# Patient Record
Sex: Female | Born: 1945
Health system: Southern US, Community
[De-identification: ages and names within clinical notes are randomized; demographics above are authoritative.]

## PROBLEM LIST (undated history)

## (undated) DIAGNOSIS — M8580 Other specified disorders of bone density and structure, unspecified site: Secondary | ICD-10-CM

## (undated) DIAGNOSIS — Z860101 Personal history of adenomatous and serrated colon polyps: Secondary | ICD-10-CM

## (undated) DIAGNOSIS — I10 Essential (primary) hypertension: Secondary | ICD-10-CM

## (undated) DIAGNOSIS — D4959 Neoplasm of unspecified behavior of other genitourinary organ: Secondary | ICD-10-CM

## (undated) DIAGNOSIS — H269 Unspecified cataract: Secondary | ICD-10-CM

## (undated) DIAGNOSIS — L900 Lichen sclerosus et atrophicus: Secondary | ICD-10-CM

## (undated) DIAGNOSIS — K635 Polyp of colon: Secondary | ICD-10-CM

## (undated) DIAGNOSIS — Z86010 Personal history of colonic polyps: Secondary | ICD-10-CM

## (undated) DIAGNOSIS — S82899A Other fracture of unspecified lower leg, initial encounter for closed fracture: Secondary | ICD-10-CM

## (undated) DIAGNOSIS — M79641 Pain in right hand: Secondary | ICD-10-CM

## (undated) DIAGNOSIS — F5104 Psychophysiologic insomnia: Secondary | ICD-10-CM

## (undated) HISTORY — DX: Psychophysiologic insomnia: F51.04

## (undated) HISTORY — DX: Lichen sclerosus et atrophicus: L90.0

## (undated) HISTORY — PX: OOPHORECTOMY: SHX86

## (undated) HISTORY — DX: Unspecified cataract: H26.9

## (undated) HISTORY — DX: Polyp of colon: K63.5

## (undated) HISTORY — DX: Other specified disorders of bone density and structure, unspecified site: M85.80

## (undated) HISTORY — DX: Other fracture of unspecified lower leg, initial encounter for closed fracture: S82.899A

## (undated) HISTORY — DX: Personal history of adenomatous and serrated colon polyps: Z86.0101

## (undated) HISTORY — DX: Personal history of colonic polyps: Z86.010

## (undated) HISTORY — DX: Pain in right hand: M79.641

## (undated) HISTORY — PX: FOOT SURGERY: SHX648

## (undated) HISTORY — PX: COLONOSCOPY: SHX174

---

## 1980-01-24 HISTORY — PX: LAPAROSCOPIC TUBAL LIGATION: SUR803

## 1988-01-24 HISTORY — PX: VAGINAL HYSTERECTOMY: SUR661

## 1997-09-02 ENCOUNTER — Other Ambulatory Visit: Admission: RE | Admit: 1997-09-02 | Discharge: 1997-09-02 | Payer: Self-pay | Admitting: Obstetrics and Gynecology

## 1998-10-05 ENCOUNTER — Other Ambulatory Visit: Admission: RE | Admit: 1998-10-05 | Discharge: 1998-10-05 | Payer: Self-pay | Admitting: Obstetrics and Gynecology

## 1999-10-10 ENCOUNTER — Other Ambulatory Visit: Admission: RE | Admit: 1999-10-10 | Discharge: 1999-10-10 | Payer: Self-pay | Admitting: Obstetrics and Gynecology

## 2000-10-18 ENCOUNTER — Other Ambulatory Visit: Admission: RE | Admit: 2000-10-18 | Discharge: 2000-10-18 | Payer: Self-pay | Admitting: Obstetrics and Gynecology

## 2001-10-21 ENCOUNTER — Other Ambulatory Visit: Admission: RE | Admit: 2001-10-21 | Discharge: 2001-10-21 | Payer: Self-pay | Admitting: Obstetrics and Gynecology

## 2002-04-20 ENCOUNTER — Emergency Department (HOSPITAL_COMMUNITY): Admission: EM | Admit: 2002-04-20 | Discharge: 2002-04-20 | Payer: Self-pay | Admitting: Emergency Medicine

## 2002-04-22 ENCOUNTER — Ambulatory Visit (HOSPITAL_COMMUNITY): Admission: RE | Admit: 2002-04-22 | Discharge: 2002-04-22 | Payer: Self-pay | Admitting: Family Medicine

## 2002-10-29 ENCOUNTER — Other Ambulatory Visit: Admission: RE | Admit: 2002-10-29 | Discharge: 2002-10-29 | Payer: Self-pay | Admitting: Obstetrics and Gynecology

## 2003-11-02 ENCOUNTER — Other Ambulatory Visit: Admission: RE | Admit: 2003-11-02 | Discharge: 2003-11-02 | Payer: Self-pay | Admitting: Obstetrics and Gynecology

## 2004-11-08 ENCOUNTER — Other Ambulatory Visit: Admission: RE | Admit: 2004-11-08 | Discharge: 2004-11-08 | Payer: Self-pay | Admitting: Obstetrics and Gynecology

## 2005-11-09 ENCOUNTER — Other Ambulatory Visit: Admission: RE | Admit: 2005-11-09 | Discharge: 2005-11-09 | Payer: Self-pay | Admitting: Obstetrics and Gynecology

## 2006-12-05 ENCOUNTER — Other Ambulatory Visit: Admission: RE | Admit: 2006-12-05 | Discharge: 2006-12-05 | Payer: Self-pay | Admitting: Obstetrics and Gynecology

## 2007-06-20 ENCOUNTER — Ambulatory Visit: Payer: Self-pay | Admitting: Internal Medicine

## 2007-07-23 ENCOUNTER — Encounter: Payer: Self-pay | Admitting: Internal Medicine

## 2007-07-23 ENCOUNTER — Ambulatory Visit: Payer: Self-pay | Admitting: Internal Medicine

## 2007-07-29 ENCOUNTER — Encounter: Payer: Self-pay | Admitting: Internal Medicine

## 2007-12-10 ENCOUNTER — Other Ambulatory Visit: Admission: RE | Admit: 2007-12-10 | Discharge: 2007-12-10 | Payer: Self-pay | Admitting: Obstetrics and Gynecology

## 2007-12-10 ENCOUNTER — Ambulatory Visit: Payer: Self-pay | Admitting: Obstetrics and Gynecology

## 2007-12-10 ENCOUNTER — Encounter: Payer: Self-pay | Admitting: Obstetrics and Gynecology

## 2008-08-25 ENCOUNTER — Ambulatory Visit: Payer: Self-pay | Admitting: Obstetrics and Gynecology

## 2009-01-05 ENCOUNTER — Other Ambulatory Visit: Admission: RE | Admit: 2009-01-05 | Discharge: 2009-01-05 | Payer: Self-pay | Admitting: Obstetrics and Gynecology

## 2009-01-05 ENCOUNTER — Ambulatory Visit: Payer: Self-pay | Admitting: Obstetrics and Gynecology

## 2010-01-06 ENCOUNTER — Other Ambulatory Visit
Admission: RE | Admit: 2010-01-06 | Discharge: 2010-01-06 | Payer: Self-pay | Source: Home / Self Care | Admitting: Obstetrics and Gynecology

## 2010-01-06 ENCOUNTER — Ambulatory Visit: Payer: Self-pay | Admitting: Obstetrics and Gynecology

## 2010-03-30 ENCOUNTER — Ambulatory Visit (INDEPENDENT_AMBULATORY_CARE_PROVIDER_SITE_OTHER): Payer: BC Managed Care – PPO | Admitting: Obstetrics and Gynecology

## 2010-03-30 DIAGNOSIS — B373 Candidiasis of vulva and vagina: Secondary | ICD-10-CM

## 2010-03-30 DIAGNOSIS — N898 Other specified noninflammatory disorders of vagina: Secondary | ICD-10-CM

## 2010-08-29 ENCOUNTER — Other Ambulatory Visit: Payer: Self-pay | Admitting: *Deleted

## 2010-08-29 ENCOUNTER — Encounter: Payer: Self-pay | Admitting: Obstetrics and Gynecology

## 2010-08-29 DIAGNOSIS — N6489 Other specified disorders of breast: Secondary | ICD-10-CM

## 2010-08-31 ENCOUNTER — Encounter: Payer: Self-pay | Admitting: Obstetrics and Gynecology

## 2010-12-22 ENCOUNTER — Encounter: Payer: Self-pay | Admitting: *Deleted

## 2010-12-22 DIAGNOSIS — M8580 Other specified disorders of bone density and structure, unspecified site: Secondary | ICD-10-CM | POA: Insufficient documentation

## 2010-12-22 DIAGNOSIS — D649 Anemia, unspecified: Secondary | ICD-10-CM | POA: Insufficient documentation

## 2011-01-11 ENCOUNTER — Ambulatory Visit (INDEPENDENT_AMBULATORY_CARE_PROVIDER_SITE_OTHER): Payer: BC Managed Care – PPO | Admitting: Obstetrics and Gynecology

## 2011-01-11 ENCOUNTER — Encounter: Payer: Self-pay | Admitting: Obstetrics and Gynecology

## 2011-01-11 ENCOUNTER — Other Ambulatory Visit (HOSPITAL_COMMUNITY)
Admission: RE | Admit: 2011-01-11 | Discharge: 2011-01-11 | Disposition: A | Discharge status: Home / Self Care | Payer: BC Managed Care – PPO | Source: Ambulatory Visit | Attending: Obstetrics and Gynecology | Admitting: Obstetrics and Gynecology

## 2011-01-11 VITALS — BP 132/80 | Ht 66.0 in | Wt 162.0 lb

## 2011-01-11 DIAGNOSIS — Z01419 Encounter for gynecological examination (general) (routine) without abnormal findings: Secondary | ICD-10-CM

## 2011-01-11 DIAGNOSIS — N644 Mastodynia: Secondary | ICD-10-CM

## 2011-01-11 DIAGNOSIS — R823 Hemoglobinuria: Secondary | ICD-10-CM

## 2011-01-11 DIAGNOSIS — N83209 Unspecified ovarian cyst, unspecified side: Secondary | ICD-10-CM

## 2011-01-11 NOTE — Progress Notes (Signed)
Addended byCammie Mcgee T on: 01/11/2011 02:39 PM   Modules accepted: Orders

## 2011-01-11 NOTE — Progress Notes (Signed)
The patient came to see me today for her annual GYN exam. She went to her PCP for her yearly visit with lab work. The patient had microscopic hematuria and was referred to urologist. CT scan of her abdomen and pelvis was done. A cystic lesion containing mural calcification was seen on the right ovary of 3 cm. The left ovary was normal. There was no ascites. In addition a low attenuation lesion was seen on the posterior right hepatic lobe a 1.7 cm. The patient completely asymptomatic. She is having no vaginal bleeding. She is having no pelvic pain. She is however having breast pain in her right upper quadrant of her right breast of 2 weeks duration. Her mammogram this year showed cyst in that area. She also has low bone mass and is currently on drug holiday from Fosam[place]. She is not having any menopausal symptoms.  HEENT: Within normal limits. Kennon Portela present. Neck: No masses. Supraclavicular lymph nodes: Not enlarged. Breasts: Examined in both sitting and lying position. Symmetrical without skin changes or masses. Abdomen: Soft no masses guarding or rebound. No hernias. Pelvic: External within normal limits. BUS within normal limits. Vaginal examination shows good estrogen effect, no cystocele enterocele or rectocele. Cervix and uterus absent. Adnexa within normal limits. Rectovaginal confirmatory. Extremities within normal limits.  Assessment: #1. Right ovarian cysts #2. Mastodynia right breast #3. Osteopenia  Plan: Pelvic ultrasound. Monitor breast pain. If pain persists in her breast 2 more weeks she should call us and we will schedule diagnostic mammogram with ultrasound of right breast. I told patient pain may be related to her cysts or cysts that is by septate. Patient will also discuss the liver lesion seen on CT scan with her PCP.

## 2011-01-24 HISTORY — PX: PELVIC LAPAROSCOPY: SHX162

## 2011-01-25 ENCOUNTER — Ambulatory Visit (INDEPENDENT_AMBULATORY_CARE_PROVIDER_SITE_OTHER): Payer: BC Managed Care – PPO

## 2011-01-25 ENCOUNTER — Ambulatory Visit (INDEPENDENT_AMBULATORY_CARE_PROVIDER_SITE_OTHER): Payer: BC Managed Care – PPO | Admitting: Obstetrics and Gynecology

## 2011-01-25 DIAGNOSIS — D3910 Neoplasm of uncertain behavior of unspecified ovary: Secondary | ICD-10-CM

## 2011-01-25 DIAGNOSIS — N7011 Chronic salpingitis: Secondary | ICD-10-CM

## 2011-01-25 DIAGNOSIS — N83209 Unspecified ovarian cyst, unspecified side: Secondary | ICD-10-CM

## 2011-01-25 DIAGNOSIS — N7013 Chronic salpingitis and oophoritis: Secondary | ICD-10-CM

## 2011-01-25 NOTE — Progress Notes (Signed)
The patient came back today to have an ultrasound because of a mass seen on the ovary on CT scan. On ultrasound today her uterus is surgically absent. The right ovary cannot be seen due to increased bowel shadowing. No mass could be appreciated. On the left ovary the patient does have microcalcifications. She also has a thin walled cystic mass of 1.3 cm with a small echogenic focus within the cyst. It is negative for vascular flow. There is also a small tubular cystic mass below the ovary of 1.5 cm. The cul-de-sac is free of fluid.  Assessment: Ovarian cyst and small hydrosalpinx of left adnexa.  Plan: Patient was reassured. Certainly I would feel better if I could see her right ovary. Since I want rescan her for stability of the left side we will get another chance to see the right side. She will take a fleets enema the night before the ultrasound. We will schedule this in 8 weeks.

## 2011-01-25 NOTE — Patient Instructions (Signed)
Schedule followup ultrasound in 2 months. Fleets enema the night before ultrasound.

## 2011-02-03 ENCOUNTER — Encounter: Payer: Self-pay | Admitting: Obstetrics and Gynecology

## 2011-03-22 ENCOUNTER — Other Ambulatory Visit: Payer: Self-pay | Admitting: Obstetrics and Gynecology

## 2011-03-22 ENCOUNTER — Ambulatory Visit (INDEPENDENT_AMBULATORY_CARE_PROVIDER_SITE_OTHER): Payer: BC Managed Care – PPO

## 2011-03-22 ENCOUNTER — Ambulatory Visit (INDEPENDENT_AMBULATORY_CARE_PROVIDER_SITE_OTHER): Payer: BC Managed Care – PPO | Admitting: Obstetrics and Gynecology

## 2011-03-22 DIAGNOSIS — R1904 Left lower quadrant abdominal swelling, mass and lump: Secondary | ICD-10-CM

## 2011-03-22 DIAGNOSIS — N949 Unspecified condition associated with female genital organs and menstrual cycle: Secondary | ICD-10-CM

## 2011-03-22 DIAGNOSIS — R1903 Right lower quadrant abdominal swelling, mass and lump: Secondary | ICD-10-CM

## 2011-03-22 DIAGNOSIS — D3910 Neoplasm of uncertain behavior of unspecified ovary: Secondary | ICD-10-CM

## 2011-03-22 DIAGNOSIS — D4959 Neoplasm of unspecified behavior of other genitourinary organ: Secondary | ICD-10-CM

## 2011-03-22 DIAGNOSIS — N83209 Unspecified ovarian cyst, unspecified side: Secondary | ICD-10-CM

## 2011-03-22 NOTE — Progress Notes (Signed)
Patient came back today for followup on ultrasound. She had a CT scan done as part of the workup for microscopic hematuria.they had seen a right adnexal mass. She came here and on ultrasound there was a mass on her left adnexa that appeared to be a hydrosalpinx but we could not image the right adnexa due to the bowel shadowing. We had her take a fleets enema and repeat the ultrasound today. On ultrasound today her uterus is surgically absent. On her right ovary there is a cystic solid mass of about 3 cm. There is echogenic debris layering and a solid calcification 9 mm. The mass is negative for PFD. On the left ovary there is a cystic mass of 1.6 cm with a solid focus within it of 6 mm. This was seen previously but the associated hydrosalpinx cannot be visualized today. This mass is also negative for PFD. The cul-de-sac is free of fluid. There is no ascitic fluid. Patient does have intermittent lower abdominal pain. However she's had this for many years.  Assessment: Bilateral adnexal masses.  Plan: CA 125 drawn. I told patient that I thought these were probably benign tumors. I also told her however that there was some risk in leaving them Even if the CA 125 is normal. We discussed laparoscopic BSO. She will think about it. We will rediscuss this with the CA 125 results.

## 2011-03-23 ENCOUNTER — Telehealth: Payer: Self-pay

## 2011-03-23 NOTE — Telephone Encounter (Signed)
At Dr. Verl Dicker request I called patient to let her know her CA 125 was normal.  Dr. Reece Agar rec Lap BSO as they discussed yesterday.  Patient said she did not know what to do. I offered her to come in and sit down and talk with Dr. Reece Agar some more about it. She asked if she could wait until after her trip March 1-9 and that would give her time to think about it.  I assured her that was fine. She did want to consult before deciding to schedule so we scheduled her, at her request, for March 19 at 9:30am.

## 2011-04-11 ENCOUNTER — Ambulatory Visit (INDEPENDENT_AMBULATORY_CARE_PROVIDER_SITE_OTHER): Payer: BC Managed Care – PPO | Admitting: Obstetrics and Gynecology

## 2011-04-11 DIAGNOSIS — R1900 Intra-abdominal and pelvic swelling, mass and lump, unspecified site: Secondary | ICD-10-CM

## 2011-04-11 NOTE — Progress Notes (Signed)
Patient came back today to discuss her complex cysts of both ovaries. I have explained to her although I suspect they're benign there is no guarantee because they are complex. We discussed laparoscopic BSO. We discussed risks including need for laparotomy, bleeding, injury to bowel bladder or ureter. After long discussion patient would rather proceed with surgery and continue ultrasound evaluation. These have been persistent on CT scan and 2 ultrasounds. We will schedule her surgery.

## 2011-04-14 ENCOUNTER — Telehealth: Payer: Self-pay

## 2011-04-14 NOTE — Telephone Encounter (Signed)
I spoke with patient and scheduled her Diag Lap w BSO for Monday April 8 7:30am at [facility].  Patient informed and instructed.  No preop consult needed per Dr. Reece Agar. Financial letter mailed to patient regarding insurance benefits and financial responsibility.

## 2011-04-20 ENCOUNTER — Encounter (HOSPITAL_BASED_OUTPATIENT_CLINIC_OR_DEPARTMENT_OTHER): Payer: Self-pay | Admitting: *Deleted

## 2011-04-24 ENCOUNTER — Encounter (HOSPITAL_BASED_OUTPATIENT_CLINIC_OR_DEPARTMENT_OTHER): Payer: Self-pay | Admitting: *Deleted

## 2011-04-24 NOTE — Progress Notes (Signed)
NPO AFTER MN . ARRIVES AT 0600. NEEDS CBC, BMET AND EKG. WILL TAKE COREG AM OF SURG. W/ SIP OF WATER. PT STATES AZOR RX RAN OUT, ADVISED PT TO CALL HER DOCTOR , GET REFILLED AND START TAKING IT THIS WEEK.

## 2011-04-25 ENCOUNTER — Telehealth: Payer: Self-pay

## 2011-04-25 NOTE — Telephone Encounter (Signed)
Patient said she takes a yoga class once a week and wondered if she would be able to do that after surgery. I told her Dr. Reece Agar will want her to wait for her two week post op check and let him examine her and he can advise her then.

## 2011-04-28 NOTE — H&P (Signed)
  Chief complaint: Bilateral complex cysts of the ovaries  History of present illness: Patient is a 66 year old gravida 2 para 2 AB 0 who during a workup for microscopic hematuria was found on CT scan to have a right adnexal mass. She was referred back to our office in January of 2013 ultrasound showed a cystic mass of her left ovary with an echogenic focus in the cyst wall. At that time bowel shadowing made it impossible to see the right ovary. She was treated with a fleets enema and on followup ultrasound on February 27 the right ovary could be seen. There was a 3.5 cm right ovarian mass which corresponded to the CT findings with echogenic debris, layering and calcification present. Patient has previously had a vaginal hysterectomy for fibroids, DUB, and anemia. CA 125 was normal. I have explained to the patient that this is unlikely to be a malignancy on either side but certainly could be a borderline tumor. Options given to the patient include observation or excision of both ovaries for a guarantee of the diagnoses. Patient understands risks of surgery and has elected to have a laparoscopy with bilateral salpingo-oophorectomy. She understands that there is some risk of requiring laparotomy.  Past medical history, family history, social history, and review of systems in epic record and reviewed.  HEENT: Within normal limits. Neck: No masses. Supraclavicular lymph nodes: Not enlarged. Breasts: Examined in both sitting and lying position. Symmetrical without skin changes or masses. Abdomen: Soft no masses guarding or rebound. No hernias. Pelvic: External within normal limits. BUS within normal limits. Vaginal examination shows good estrogen effect, no cystocele enterocele or rectocele. Cervix and uterus absent. Adnexa within normal limits. Rectovaginal confirmatory. Extremities within normal limits.  Assessment: Bilateral complex ovarian cysts  Plan: Diagnostic laparoscopy with bilateral  salpingo-oophorectomy

## 2011-04-30 NOTE — Anesthesia Preprocedure Evaluation (Signed)
Anesthesia Evaluation  Patient identified by MRN, date of birth, ID band Patient awake    Reviewed: Allergy & Precautions, H&P , NPO status , Patient's Chart, lab work & pertinent test results  Airway Mallampati: II TM Distance: >3 FB Neck ROM: Full    Dental No notable dental hx.    Pulmonary neg pulmonary ROS,  breath sounds clear to auscultation  Pulmonary exam normal       Cardiovascular hypertension, Pt. on medications and Pt. on home beta blockers Rhythm:Regular Rate:Normal     Neuro/Psych negative neurological ROS  negative psych ROS   GI/Hepatic negative GI ROS, Neg liver ROS,   Endo/Other  negative endocrine ROS  Renal/GU negative Renal ROS  negative genitourinary   Musculoskeletal negative musculoskeletal ROS (+)   Abdominal   Peds negative pediatric ROS (+)  Hematology negative hematology ROS (+)   Anesthesia Other Findings   Reproductive/Obstetrics negative OB ROS                           Anesthesia Physical Anesthesia Plan  ASA: II  Anesthesia Plan: General   Post-op Pain Management:    Induction: Intravenous  Airway Management Planned: Oral ETT  Additional Equipment:   Intra-op Plan:   Post-operative Plan: Extubation in OR  Informed Consent: I have reviewed the patients History and Physical, chart, labs and discussed the procedure including the risks, benefits and alternatives for the proposed anesthesia with the patient or authorized representative who has indicated his/her understanding and acceptance.   Dental advisory given  Plan Discussed with: CRNA  Anesthesia Plan Comments:         Anesthesia Quick Evaluation

## 2011-05-01 ENCOUNTER — Ambulatory Visit (HOSPITAL_BASED_OUTPATIENT_CLINIC_OR_DEPARTMENT_OTHER): Payer: BC Managed Care – PPO | Admitting: Anesthesiology

## 2011-05-01 ENCOUNTER — Encounter (HOSPITAL_BASED_OUTPATIENT_CLINIC_OR_DEPARTMENT_OTHER): Payer: Self-pay | Admitting: *Deleted

## 2011-05-01 ENCOUNTER — Ambulatory Visit (HOSPITAL_BASED_OUTPATIENT_CLINIC_OR_DEPARTMENT_OTHER)
Admission: RE | Admit: 2011-05-01 | Discharge: 2011-05-01 | Disposition: A | Discharge status: Home / Self Care | Payer: BC Managed Care – PPO | Source: Ambulatory Visit | Attending: Obstetrics and Gynecology | Admitting: Obstetrics and Gynecology

## 2011-05-01 ENCOUNTER — Encounter (HOSPITAL_BASED_OUTPATIENT_CLINIC_OR_DEPARTMENT_OTHER): Payer: Self-pay | Admitting: Anesthesiology

## 2011-05-01 ENCOUNTER — Encounter (HOSPITAL_BASED_OUTPATIENT_CLINIC_OR_DEPARTMENT_OTHER)
Admission: RE | Disposition: A | Discharge status: Home / Self Care | Payer: Self-pay | Source: Ambulatory Visit | Attending: Obstetrics and Gynecology

## 2011-05-01 DIAGNOSIS — N83209 Unspecified ovarian cyst, unspecified side: Secondary | ICD-10-CM

## 2011-05-01 DIAGNOSIS — D3910 Neoplasm of uncertain behavior of unspecified ovary: Secondary | ICD-10-CM

## 2011-05-01 DIAGNOSIS — Z90710 Acquired absence of both cervix and uterus: Secondary | ICD-10-CM | POA: Insufficient documentation

## 2011-05-01 DIAGNOSIS — D279 Benign neoplasm of unspecified ovary: Secondary | ICD-10-CM | POA: Insufficient documentation

## 2011-05-01 DIAGNOSIS — N9489 Other specified conditions associated with female genital organs and menstrual cycle: Secondary | ICD-10-CM | POA: Insufficient documentation

## 2011-05-01 HISTORY — DX: Neoplasm of unspecified behavior of other genitourinary organ: D49.59

## 2011-05-01 HISTORY — DX: Essential (primary) hypertension: I10

## 2011-05-01 LAB — CBC
HCT: 40.8 % (ref 36.0–46.0)
MCH: 28.5 pg (ref 26.0–34.0)
MCV: 86.1 fL (ref 78.0–100.0)
Platelets: 194 10*3/uL (ref 150–400)
RBC: 4.74 MIL/uL (ref 3.87–5.11)
RDW: 12.7 % (ref 11.5–15.5)

## 2011-05-01 LAB — BASIC METABOLIC PANEL
CO2: 27 mEq/L (ref 19–32)
Calcium: 9.3 mg/dL (ref 8.4–10.5)
Chloride: 105 mEq/L (ref 96–112)
Creatinine, Ser: 0.84 mg/dL (ref 0.50–1.10)
Glucose, Bld: 93 mg/dL (ref 70–99)

## 2011-05-01 SURGERY — SALPINGO-OOPHORECTOMY, BILATERAL, LAPAROSCOPIC
Anesthesia: General | Site: Abdomen | Laterality: Bilateral | Wound class: Clean

## 2011-05-01 MED ORDER — NEOSTIGMINE METHYLSULFATE 1 MG/ML IJ SOLN
INTRAMUSCULAR | Status: DC | PRN
  Administered 2011-05-01: 4 mg via INTRAVENOUS

## 2011-05-01 MED ORDER — HYDROCODONE-ACETAMINOPHEN 5-500 MG PO TABS: 1.0000 | ORAL_TABLET | Freq: Four times a day (QID) | ORAL | Status: AC | PRN

## 2011-05-01 MED ORDER — FENTANYL CITRATE 0.05 MG/ML IJ SOLN
INTRAMUSCULAR | Status: DC | PRN
  Administered 2011-05-01 (×2): 50 ug via INTRAVENOUS
  Administered 2011-05-01: 100 ug via INTRAVENOUS

## 2011-05-01 MED ORDER — LACTATED RINGERS IV SOLN
INTRAVENOUS | Status: DC
  Administered 2011-05-01 (×2): via INTRAVENOUS

## 2011-05-01 MED ORDER — HEPARIN SOD (PORK) LOCK FLUSH 100 UNIT/ML IV SOLN
INTRAVENOUS | Status: DC | PRN
  Administered 2011-05-01: 500 [IU] via INTRAVENOUS

## 2011-05-01 MED ORDER — ONDANSETRON HCL 4 MG/2ML IJ SOLN
INTRAMUSCULAR | Status: DC | PRN
  Administered 2011-05-01: 4 mg via INTRAVENOUS

## 2011-05-01 MED ORDER — DEXAMETHASONE SODIUM PHOSPHATE 4 MG/ML IJ SOLN
INTRAMUSCULAR | Status: DC | PRN
  Administered 2011-05-01: 10 mg via INTRAVENOUS

## 2011-05-01 MED ORDER — CEFAZOLIN SODIUM 1-5 GM-% IV SOLN
1.0000 g | INTRAVENOUS | Status: AC
  Administered 2011-05-01: 1 g via INTRAVENOUS

## 2011-05-01 MED ORDER — MIDAZOLAM HCL 5 MG/5ML IJ SOLN
INTRAMUSCULAR | Status: DC | PRN
  Administered 2011-05-01: 2 mg via INTRAVENOUS

## 2011-05-01 MED ORDER — GLYCOPYRROLATE 0.2 MG/ML IJ SOLN
INTRAMUSCULAR | Status: DC | PRN
  Administered 2011-05-01: .8 mg via INTRAVENOUS

## 2011-05-01 MED ORDER — LACTATED RINGERS IR SOLN
Status: DC | PRN
  Administered 2011-05-01: 3000 mL

## 2011-05-01 MED ORDER — FENTANYL CITRATE 0.05 MG/ML IJ SOLN: 25.0000 ug | INTRAMUSCULAR | Status: DC | PRN

## 2011-05-01 MED ORDER — KETOROLAC TROMETHAMINE 30 MG/ML IJ SOLN
INTRAMUSCULAR | Status: DC | PRN
  Administered 2011-05-01: 30 mg via INTRAVENOUS

## 2011-05-01 MED ORDER — PROMETHAZINE HCL 25 MG/ML IJ SOLN: 6.2500 mg | INTRAMUSCULAR | Status: DC | PRN

## 2011-05-01 MED ORDER — PROPOFOL 10 MG/ML IV EMUL
INTRAVENOUS | Status: DC | PRN
  Administered 2011-05-01: 150 mg via INTRAVENOUS
  Administered 2011-05-01: 50 mg via INTRAVENOUS

## 2011-05-01 SURGICAL SUPPLY — 64 items
ADH SKN CLS APL DERMABOND .7 (GAUZE/BANDAGES/DRESSINGS) ×4
APL SKNCLS STERI-STRIP NONHPOA (GAUZE/BANDAGES/DRESSINGS) ×2
APPLICATOR COTTON TIP 6IN STRL (MISCELLANEOUS) ×3 IMPLANT
BAG SPEC RTRVL LRG 6X4 10 (ENDOMECHANICALS)
BAG URINE DRAINAGE (UROLOGICAL SUPPLIES) ×2 IMPLANT
BANDAGE ADHESIVE 1X3 (GAUZE/BANDAGES/DRESSINGS) IMPLANT
BENZOIN TINCTURE PRP APPL 2/3 (GAUZE/BANDAGES/DRESSINGS) ×3 IMPLANT
BLADE SURG 11 STRL SS (BLADE) ×3 IMPLANT
CANISTER SUCTION 1200CC (MISCELLANEOUS) IMPLANT
CANISTER SUCTION 2500CC (MISCELLANEOUS) ×4 IMPLANT
CATH FOLEY 2WAY  5CC 16FR SIL (CATHETERS) ×1
CATH FOLEY 2WAY 5CC 16FR SIL (CATHETERS) ×1 IMPLANT
CATH ROBINSON RED A/P 16FR (CATHETERS) IMPLANT
CLOTH BEACON ORANGE TIMEOUT ST (SAFETY) ×3 IMPLANT
DERMABOND ADVANCED (GAUZE/BANDAGES/DRESSINGS) ×2
DERMABOND ADVANCED .7 DNX12 (GAUZE/BANDAGES/DRESSINGS) ×2 IMPLANT
DRAPE CAMERA CLOSED 9X96 (DRAPES) ×3 IMPLANT
DRAPE UNDERBUTTOCKS STRL (DRAPE) ×3 IMPLANT
DRESSING TELFA 8X3 (GAUZE/BANDAGES/DRESSINGS) IMPLANT
ELECT REM PT RETURN 9FT ADLT (ELECTROSURGICAL) ×3
ELECTRODE REM PT RTRN 9FT ADLT (ELECTROSURGICAL) ×2 IMPLANT
FILTER SMOKE EVAC LAPAROSHD (FILTER) IMPLANT
FORCEPS BIOP CUTTING (INSTRUMENTS) IMPLANT
GAS CARTRIDGE (MEDICAL GASES) IMPLANT
GLOVE BIO SURGEON STRL SZ7 (GLOVE) ×2 IMPLANT
GLOVE BIO SURGEON STRL SZ7.5 (GLOVE) ×2 IMPLANT
GLOVE ECLIPSE 6.0 STRL STRAW (GLOVE) ×2 IMPLANT
GLOVE ECLIPSE 7.0 STRL STRAW (GLOVE) ×6 IMPLANT
GLOVE INDICATOR 7.5 STRL GRN (GLOVE) ×3 IMPLANT
GOWN PREVENTION PLUS LG XLONG (DISPOSABLE) ×3 IMPLANT
GOWN SURGICAL XLG (GOWNS) ×4 IMPLANT
LAPAROSCOPY HANDPIECE LONG (MISCELLANEOUS) IMPLANT
LIGASURE 5MM LAPAROSCOPIC (INSTRUMENTS) IMPLANT
NS IRRIG 500ML POUR BTL (IV SOLUTION) IMPLANT
PACK BASIN DAY SURGERY FS (CUSTOM PROCEDURE TRAY) ×3 IMPLANT
PACK LAPAROSCOPY II (CUSTOM PROCEDURE TRAY) ×3 IMPLANT
PAD OB MATERNITY 4.3X12.25 (PERSONAL CARE ITEMS) ×3 IMPLANT
PAD PREP 24X48 CUFFED NSTRL (MISCELLANEOUS) ×3 IMPLANT
POUCH SPECIMEN RETRIEVAL 10MM (ENDOMECHANICALS) IMPLANT
SCALPEL HARMONIC ACE (MISCELLANEOUS) IMPLANT
SCISSORS LAP 5X35 DISP (ENDOMECHANICALS) IMPLANT
SEALER TISSUE G2 CVD JAW 35 (ENDOMECHANICALS) ×1 IMPLANT
SEALER TISSUE G2 CVD JAW 45CM (ENDOMECHANICALS) ×1
SET IRRIG TUBING LAPAROSCOPIC (IRRIGATION / IRRIGATOR) ×2 IMPLANT
SOLUTION ANTI FOG 6CC (MISCELLANEOUS) ×3 IMPLANT
SOLUTION ELECTROLUBE (MISCELLANEOUS) IMPLANT
STRIP CLOSURE SKIN 1/4X4 (GAUZE/BANDAGES/DRESSINGS) IMPLANT
SUT MNCRL AB 3-0 PS2 18 (SUTURE) IMPLANT
SUT MON AB 3-0 SH 27 (SUTURE) ×3 IMPLANT
SUT VICRYL 0 UR6 27IN ABS (SUTURE) ×6 IMPLANT
SYR 3ML 23GX1 SAFETY (SYRINGE) IMPLANT
SYRINGE 10CC LL (SYRINGE) IMPLANT
TOWEL OR 17X24 6PK STRL BLUE (TOWEL DISPOSABLE) ×8 IMPLANT
TRAY DSU PREP LF (CUSTOM PROCEDURE TRAY) ×3 IMPLANT
TROCAR 12M 150ML BLUNT (TROCAR) IMPLANT
TROCAR 5M 150ML BLDLS (TROCAR) IMPLANT
TROCAR BLADED SMOOTH C0639 (ENDOMECHANICALS) IMPLANT
TROCAR CANNULA 5X100MM C0Q10 (TROCAR) ×2 IMPLANT
TROCAR KII 5X100MM (TROCAR) ×1 IMPLANT
TROCAR XCEL BLUNT TIP 100MML (ENDOMECHANICALS) IMPLANT
TROCAR XCEL NON-BLD 11X100MML (ENDOMECHANICALS) ×3 IMPLANT
TUBING INSUFFLATION W/FILTER (TUBING) ×3 IMPLANT
VACUUM HOSE/TUBING 7/8INX6FT (MISCELLANEOUS) IMPLANT
WATER STERILE IRR 500ML POUR (IV SOLUTION) ×3 IMPLANT

## 2011-05-01 NOTE — Interval H&P Note (Signed)
History and Physical Interval Note:  05/01/2011 7:14 AM  Nancy Beck  has presented today for surgery, with the diagnosis of bilateral ovarian neoplasms  The various methods of treatment have been discussed with the patient and family. After consideration of risks, benefits and other options for treatment, the patient has consented to  Procedure(s) (LRB): LAPAROSCOPY DIAGNOSTIC (N/A) LAPAROSCOPIC BILATERAL SALPINGO OOPHERECTOMY (N/A) as a surgical intervention .  The patients' history has been reviewed, patient examined, no change in status, stable for surgery.  I have reviewed the patients' chart and labs.  Questions were answered to the patient's satisfaction.     Katia Hannen L  Date of Initial H&P: 04/27/2012  History reviewed, patient examined, no change in status, stable for surgery.

## 2011-05-01 NOTE — Anesthesia Procedure Notes (Signed)
Procedure Name: Intubation Date/Time: 05/01/2011 7:51 AM Performed by: Maris Berger T Pre-anesthesia Checklist: Patient identified, Emergency Drugs available, Suction available and Patient being monitored Patient Re-evaluated:Patient Re-evaluated prior to inductionOxygen Delivery Method: Circle System Utilized Preoxygenation: Pre-oxygenation with 100% oxygen Intubation Type: IV induction Ventilation: Mask ventilation without difficulty Laryngoscope Size: Mac and 3 Grade View: Grade I Tube type: Oral Number of attempts: 1 Airway Equipment and Method: stylet and LTA kit utilized Placement Confirmation: ETT inserted through vocal cords under direct vision,  positive ETCO2 and breath sounds checked- equal and bilateral Secured at: 22 cm Tube secured with: Tape Dental Injury: Teeth and Oropharynx as per pre-operative assessment

## 2011-05-01 NOTE — Transfer of Care (Signed)
Immediate Anesthesia Transfer of Care Note  Patient: Nancy Beck  Procedure(s) Performed: Procedure(s) (LRB): LAPAROSCOPIC BILATERAL SALPINGO OOPHERECTOMY (Bilateral)  Patient Location: PACU  Anesthesia Type: General  Level of Consciousness: awake, alert  and oriented  Airway & Oxygen Therapy: Patient Spontanous Breathing and Patient connected to face mask oxygen  Post-op Assessment: Report given to PACU RN  Post vital signs: Reviewed and stable  Complications: No apparent anesthesia complications

## 2011-05-01 NOTE — Progress Notes (Signed)
Assisted pt to bathroom to void. Pt became nausea & dizzy. VSS. No acute distress noted. Assisted pt back to recliner until pt feels better.

## 2011-05-01 NOTE — Anesthesia Postprocedure Evaluation (Signed)
  Anesthesia Post-op Note  Patient: Nancy Beck  Procedure(s) Performed: Procedure(s) (LRB): LAPAROSCOPIC BILATERAL SALPINGO OOPHERECTOMY (Bilateral)  Patient Location: PACU  Anesthesia Type: General  Level of Consciousness: awake and alert   Airway and Oxygen Therapy: Patient Spontanous Breathing  Post-op Pain: mild  Post-op Assessment: Post-op Vital signs reviewed, Patient's Cardiovascular Status Stable, Respiratory Function Stable, Patent Airway and No signs of Nausea or vomiting  Post-op Vital Signs: stable  Complications: No apparent anesthesia complications

## 2011-05-01 NOTE — Discharge Instructions (Signed)
Unilateral Salpingo-Oophorectomy Unilateral salpingo-oophorectomy is the removal of one fallopian tube and ovary. The fallopian tubes transport the egg from the ovary to the womb (uterus). The fallopian tube is also where the sperm and egg meet and become fertilized and move down into the uterus.  Removing one tube and ovary will not:  Cause problems with your menstrual periods.   Cause problems with your sex drive (libido).   Put you into the menopause.   Give symptoms of the menopause.   Make you not able to get pregnant (sterile).  There are several reasons for doing a salpingo-oophorectomy:  Infection of the tube and ovary.   There may be scar tissue of the tube and ovary (adhesions).   It may be necessary to remove the tube when the ovary has a cyst or tumor.   It may have to be done when removing the uterus.   It may have to be done when there is cancer of the tube or ovary.  LET YOUR CAREGIVER KNOW ABOUT:  Allergies to food or medications.   All the medications you are taking including prescription and over-the-counter herbs, eye drops and creams.   If you are using illegal drugs or excessive alcohol.   Your smoking habits.   Previous problems with anesthesia including numbing medication.   The possibility of being pregnant.   History of blood clots or bleeding problems.   Previous surgery.   Any other medical or health problems.  RISKS AND COMPLICATIONS  All surgery is associated with risks. Some of these risks are:  Injury to surrounding organs.   Bleeding.   Infection.   Blood clots in the legs or lungs.   Problems with the anesthesia.   The surgery does not help the problem.   Death.  BEFORE THE PROCEDURE  Do not take aspirin or blood thinners because it can make you bleed.   Do not eat or drink anything at least 8 hours before the surgery.   Let your caregiver know if you develop a cold or an infection.   If you are being admitted the day  of surgery, arrive at least one hour before the surgery.   Arrange for help when you go home from the hospital.   If you smoke, do not smoke for at least 2 weeks before the surgery.  PROCEDURE After being admitted to the hospital, you will change into a hospital gown. Then, you will be given an IV (intravenous) and a medication to relax you. Then, you will be put to sleep with an anesthetic. Any hair on your lower belly (abdomen) will be removed, and a catheter will be placed in your bladder. The fallopian tube and ovary will be removed either through 2 very small cuts (incisions) or through large incision in the lower abdomen. The blood vessels will be clamped and tied. AFTER THE PROCEDURE  You will be taken to the recovery room for 1 to 3 hours until your blood pressure, pulse and temperature are stable and you are waking up.   If you had a laparoscopy, you may be discharged in several hours.   If you had a large incision, you will be admitted to the hospital for a day or two.   If you had a laparoscopy, you may have shoulder pain. This is not unusual. It is from air that is left in the abdomen and affects the nerve that goes from the diaphragm to the shoulder. It goes away in a day or  two.   You will be given pain medication as necessary.   The intravenous and catheter will be removed before you are discharged.   Have someone available to take you home.  HOME CARE INSTRUCTIONS   It is normal to be sore for a week or two. Call your caregiver if the pain is getting worse or the pain medication is not helping.   Have help when you go home for a week or so to help with the household chores.   Follow your caregiver's advice regarding diet.   Get rest and sleep.   Only take over-the-counter or prescription medicines for pain or discomfort as directed by your caregiver.   Do not take aspirin. It can cause bleeding.   Do not drive, exercise or lift anything over 5 pounds.   Do not  drink alcohol until your caregiver gives you permission.   Do not lift anything over 5 pounds.   Do not have sexual intercourse until your caregiver says it is OK.   Take your temperature twice a day and write it down.   Change the bandage (dressing) as directed.   Make and keep your follow-up appointments for postoperative care.   If you become constipated, ask your caregiver about taking a mild laxative. Drinking more liquids than usual and eating bran foods can help prevent constipation.  SEEK MEDICAL CARE IF:   You have swelling or redness around the cut (incision).   You develop a rash.   You have side effects from the medication.   You feel lightheaded.   You need more or stronger medication.   You have pain, swelling or redness where the IV (intravenous) was placed.  SEEK IMMEDIATE MEDICAL CARE IF:   You develop an unexplained temperature above 100 F (37.8 C).   You develop increasing belly (abdominal) pain.   You have pus coming out of the incision.   You notice a bad smell coming from the wound or dressing.   The incision is separating.   There is excessive vaginal bleeding.   You start to feel sick to your stomach (nauseous) and vomit.   You have leg or chest pain.   You have pain when you urinate.   You develop shortness of breath.   You pass out.  Document Released: 11/06/2008 Document Revised: 12/29/2010 Document Reviewed: 11/06/2008 [facility] Patient Information 2012 [place], [place].Unilateral Salpingo-Oophorectomy Unilateral salpingo-oophorectomy is the removal of one fallopian tube and ovary. The fallopian tubes transport the egg from the ovary to the womb (uterus). The fallopian tube is also where the sperm and egg meet and become fertilized and move down into the uterus.  Removing one tube and ovary will not:  Cause problems with your menstrual periods.   Cause problems with your sex drive (libido).   Put you into the menopause.   Give  symptoms of the menopause.   Make you not able to get pregnant (sterile).  There are several reasons for doing a salpingo-oophorectomy:  Infection of the tube and ovary.   There may be scar tissue of the tube and ovary (adhesions).   It may be necessary to remove the tube when the ovary has a cyst or tumor.   It may have to be done when removing the uterus.   It may have to be done when there is cancer of the tube or ovary.  LET YOUR CAREGIVER KNOW ABOUT:  Allergies to food or medications.   All the medications you are taking including  prescription and over-the-counter herbs, eye drops and creams.   If you are using illegal drugs or excessive alcohol.   Your smoking habits.   Previous problems with anesthesia including numbing medication.   The possibility of being pregnant.   History of blood clots or bleeding problems.   Previous surgery.   Any other medical or health problems.  RISKS AND COMPLICATIONS  All surgery is associated with risks. Some of these risks are:  Injury to surrounding organs.   Bleeding.   Infection.   Blood clots in the legs or lungs.   Problems with the anesthesia.   The surgery does not help the problem.   Death.  BEFORE THE PROCEDURE  Do not take aspirin or blood thinners because it can make you bleed.   Do not eat or drink anything at least 8 hours before the surgery.   Let your caregiver know if you develop a cold or an infection.   If you are being admitted the day of surgery, arrive at least one hour before the surgery.   Arrange for help when you go home from the hospital.   If you smoke, do not smoke for at least 2 weeks before the surgery.  PROCEDURE After being admitted to the hospital, you will change into a hospital gown. Then, you will be given an IV (intravenous) and a medication to relax you. Then, you will be put to sleep with an anesthetic. Any hair on your lower belly (abdomen) will be removed, and a catheter  will be placed in your bladder. The fallopian tube and ovary will be removed either through 2 very small cuts (incisions) or through large incision in the lower abdomen. The blood vessels will be clamped and tied. AFTER THE PROCEDURE  You will be taken to the recovery room for 1 to 3 hours until your blood pressure, pulse and temperature are stable and you are waking up.   If you had a laparoscopy, you may be discharged in several hours.   If you had a large incision, you will be admitted to the hospital for a day or two.   If you had a laparoscopy, you may have shoulder pain. This is not unusual. It is from air that is left in the abdomen and affects the nerve that goes from the diaphragm to the shoulder. It goes away in a day or two.   You will be given pain medication as necessary.   The intravenous and catheter will be removed before you are discharged.   Have someone available to take you home.  HOME CARE INSTRUCTIONS   It is normal to be sore for a week or two. Call your caregiver if the pain is getting worse or the pain medication is not helping.   Have help when you go home for a week or so to help with the household chores.   Follow your caregiver's advice regarding diet.   Get rest and sleep.   Only take over-the-counter or prescription medicines for pain or discomfort as directed by your caregiver.   Do not take aspirin. It can cause bleeding.   Do not drive, exercise or lift anything over 5 pounds.   Do not drink alcohol until your caregiver gives you permission.   Do not lift anything over 5 pounds.   Do not have sexual intercourse until your caregiver says it is OK.   Take your temperature twice a day and write it down.   Change the bandage (dressing)  as directed.   Make and keep your follow-up appointments for postoperative care.   If you become constipated, ask your caregiver about taking a mild laxative. Drinking more liquids than usual and eating bran  foods can help prevent constipation.  SEEK MEDICAL CARE IF:   You have swelling or redness around the cut (incision).   You develop a rash.   You have side effects from the medication.   You feel lightheaded.   You need more or stronger medication.   You have pain, swelling or redness where the IV (intravenous) was placed.  SEEK IMMEDIATE MEDICAL CARE IF:   You develop an unexplained temperature above 100 F (37.8 C).   You develop increasing belly (abdominal) pain.   You have pus coming out of the incision.   You notice a bad smell coming from the wound or dressing.   The incision is separating.   There is excessive vaginal bleeding.   You start to feel sick to your stomach (nauseous) and vomit.   You have leg or chest pain.   You have pain when you urinate.   You develop shortness of breath.   You pass out.  Document Released: 11/06/2008 Document Revised: 12/29/2010 Document Reviewed: 11/06/2008 [facility] Patient Information 2012 [place], [place].Unilateral Salpingo-Oophorectomy Unilateral salpingo-oophorectomy is the removal of one fallopian tube and ovary. The fallopian tubes transport the egg from the ovary to the womb (uterus). The fallopian tube is also where the sperm and egg meet and become fertilized and move down into the uterus.  Removing one tube and ovary will not:  Cause problems with your menstrual periods.   Cause problems with your sex drive (libido).   Put you into the menopause.   Give symptoms of the menopause.   Make you not able to get pregnant (sterile).  There are several reasons for doing a salpingo-oophorectomy:  Infection of the tube and ovary.   There may be scar tissue of the tube and ovary (adhesions).   It may be necessary to remove the tube when the ovary has a cyst or tumor.   It may have to be done when removing the uterus.   It may have to be done when there is cancer of the tube or ovary.  LET YOUR CAREGIVER KNOW  ABOUT:  Allergies to food or medications.   All the medications you are taking including prescription and over-the-counter herbs, eye drops and creams.   If you are using illegal drugs or excessive alcohol.   Your smoking habits.   Previous problems with anesthesia including numbing medication.   The possibility of being pregnant.   History of blood clots or bleeding problems.   Previous surgery.   Any other medical or health problems.  RISKS AND COMPLICATIONS  All surgery is associated with risks. Some of these risks are:  Injury to surrounding organs.   Bleeding.   Infection.   Blood clots in the legs or lungs.   Problems with the anesthesia.   The surgery does not help the problem.   Death.  BEFORE THE PROCEDURE  Do not take aspirin or blood thinners because it can make you bleed.   Do not eat or drink anything at least 8 hours before the surgery.   Let your caregiver know if you develop a cold or an infection.   If you are being admitted the day of surgery, arrive at least one hour before the surgery.   Arrange for help when you go home from  the hospital.   If you smoke, do not smoke for at least 2 weeks before the surgery.  PROCEDURE After being admitted to the hospital, you will change into a hospital gown. Then, you will be given an IV (intravenous) and a medication to relax you. Then, you will be put to sleep with an anesthetic. Any hair on your lower belly (abdomen) will be removed, and a catheter will be placed in your bladder. The fallopian tube and ovary will be removed either through 2 very small cuts (incisions) or through large incision in the lower abdomen. The blood vessels will be clamped and tied. AFTER THE PROCEDURE  You will be taken to the recovery room for 1 to 3 hours until your blood pressure, pulse and temperature are stable and you are waking up.   If you had a laparoscopy, you may be discharged in several hours.   If you had a large  incision, you will be admitted to the hospital for a day or two.   If you had a laparoscopy, you may have shoulder pain. This is not unusual. It is from air that is left in the abdomen and affects the nerve that goes from the diaphragm to the shoulder. It goes away in a day or two.   You will be given pain medication as necessary.   The intravenous and catheter will be removed before you are discharged.   Have someone available to take you home.  HOME CARE INSTRUCTIONS   It is normal to be sore for a week or two. Call your caregiver if the pain is getting worse or the pain medication is not helping.   Have help when you go home for a week or so to help with the household chores.   Follow your caregiver's advice regarding diet.   Get rest and sleep.   Only take over-the-counter or prescription medicines for pain or discomfort as directed by your caregiver.   Do not take aspirin. It can cause bleeding.   Do not drive, exercise or lift anything over 5 pounds.   Do not drink alcohol until your caregiver gives you permission.   Do not lift anything over 5 pounds.   Do not have sexual intercourse until your caregiver says it is OK.   Take your temperature twice a day and write it down.   Change the bandage (dressing) as directed.   Make and keep your follow-up appointments for postoperative care.   If you become constipated, ask your caregiver about taking a mild laxative. Drinking more liquids than usual and eating bran foods can help prevent constipation.  SEEK MEDICAL CARE IF:   You have swelling or redness around the cut (incision).   You develop a rash.   You have side effects from the medication.   You feel lightheaded.   You need more or stronger medication.   You have pain, swelling or redness where the IV (intravenous) was placed.  SEEK IMMEDIATE MEDICAL CARE IF:   You develop an unexplained temperature above 100 F (37.8 C).   You develop increasing belly  (abdominal) pain.   You have pus coming out of the incision.   You notice a bad smell coming from the wound or dressing.   The incision is separating.   There is excessive vaginal bleeding.   You start to feel sick to your stomach (nauseous) and vomit.   You have leg or chest pain.   You have pain when you urinate.  You develop shortness of breath.   You pass out.  Document Released: 11/06/2008 Document Revised: 12/29/2010 Document Reviewed: 11/06/2008 [facility] Patient Information 2012 [place], [place].Unilateral Salpingo-Oophorectomy Unilateral salpingo-oophorectomy is the removal of one fallopian tube and ovary. The fallopian tubes transport the egg from the ovary to the womb (uterus). The fallopian tube is also where the sperm and egg meet and become fertilized and move down into the uterus.  Removing one tube and ovary will not:  Cause problems with your menstrual periods.   Cause problems with your sex drive (libido).   Put you into the menopause.   Give symptoms of the menopause.   Make you not able to get pregnant (sterile).  There are several reasons for doing a salpingo-oophorectomy:  Infection of the tube and ovary.   There may be scar tissue of the tube and ovary (adhesions).   It may be necessary to remove the tube when the ovary has a cyst or tumor.   It may have to be done when removing the uterus.   It may have to be done when there is cancer of the tube or ovary.  LET YOUR CAREGIVER KNOW ABOUT:  Allergies to food or medications.   All the medications you are taking including prescription and over-the-counter herbs, eye drops and creams.   If you are using illegal drugs or excessive alcohol.   Your smoking habits.   Previous problems with anesthesia including numbing medication.   The possibility of being pregnant.   History of blood clots or bleeding problems.   Previous surgery.   Any other medical or health problems.  RISKS AND  COMPLICATIONS  All surgery is associated with risks. Some of these risks are:  Injury to surrounding organs.   Bleeding.   Infection.   Blood clots in the legs or lungs.   Problems with the anesthesia.   The surgery does not help the problem.   Death.  BEFORE THE PROCEDURE  Do not take aspirin or blood thinners because it can make you bleed.   Do not eat or drink anything at least 8 hours before the surgery.   Let your caregiver know if you develop a cold or an infection.   If you are being admitted the day of surgery, arrive at least one hour before the surgery.   Arrange for help when you go home from the hospital.   If you smoke, do not smoke for at least 2 weeks before the surgery.  PROCEDURE After being admitted to the hospital, you will change into a hospital gown. Then, you will be given an IV (intravenous) and a medication to relax you. Then, you will be put to sleep with an anesthetic. Any hair on your lower belly (abdomen) will be removed, and a catheter will be placed in your bladder. The fallopian tube and ovary will be removed either through 2 very small cuts (incisions) or through large incision in the lower abdomen. The blood vessels will be clamped and tied. AFTER THE PROCEDURE  You will be taken to the recovery room for 1 to 3 hours until your blood pressure, pulse and temperature are stable and you are waking up.   If you had a laparoscopy, you may be discharged in several hours.   If you had a large incision, you will be admitted to the hospital for a day or two.   If you had a laparoscopy, you may have shoulder pain. This is not unusual. It is from air  that is left in the abdomen and affects the nerve that goes from the diaphragm to the shoulder. It goes away in a day or two.   You will be given pain medication as necessary.   The intravenous and catheter will be removed before you are discharged.   Have someone available to take you home.  HOME CARE  INSTRUCTIONS   It is normal to be sore for a week or two. Call your caregiver if the pain is getting worse or the pain medication is not helping.   Have help when you go home for a week or so to help with the household chores.   Follow your caregiver's advice regarding diet.   Get rest and sleep.   Only take over-the-counter or prescription medicines for pain or discomfort as directed by your caregiver.   Do not take aspirin. It can cause bleeding.   Do not drive, exercise or lift anything over 5 pounds.   Do not drink alcohol until your caregiver gives you permission.   Do not lift anything over 5 pounds.   Do not have sexual intercourse until your caregiver says it is OK.   Take your temperature twice a day and write it down.   Change the bandage (dressing) as directed.   Make and keep your follow-up appointments for postoperative care.   If you become constipated, ask your caregiver about taking a mild laxative. Drinking more liquids than usual and eating bran foods can help prevent constipation.  SEEK MEDICAL CARE IF:   You have swelling or redness around the cut (incision).   You develop a rash.   You have side effects from the medication.   You feel lightheaded.   You need more or stronger medication.   You have pain, swelling or redness where the IV (intravenous) was placed.  SEEK IMMEDIATE MEDICAL CARE IF:   You develop an unexplained temperature above 100 F (37.8 C).   You develop increasing belly (abdominal) pain.   You have pus coming out of the incision.   You notice a bad smell coming from the wound or dressing.   The incision is separating.   There is excessive vaginal bleeding.   You start to feel sick to your stomach (nauseous) and vomit.   You have leg or chest pain.   You have pain when you urinate.   You develop shortness of breath.   You pass out.  Document Released: 11/06/2008 Document Revised: 12/29/2010 Document Reviewed:  11/06/2008 [facility] Patient Information 2012 [place], [place].Unilateral Salpingo-Oophorectomy Unilateral salpingo-oophorectomy is the removal of one fallopian tube and ovary. The fallopian tubes transport the egg from the ovary to the womb (uterus). The fallopian tube is also where the sperm and egg meet and become fertilized and move down into the uterus.  Removing one tube and ovary will not:  Cause problems with your menstrual periods.   Cause problems with your sex drive (libido).   Put you into the menopause.   Give symptoms of the menopause.   Make you not able to get pregnant (sterile).  There are several reasons for doing a salpingo-oophorectomy:  Infection of the tube and ovary.   There may be scar tissue of the tube and ovary (adhesions).   It may be necessary to remove the tube when the ovary has a cyst or tumor.   It may have to be done when removing the uterus.   It may have to be done when there is cancer of  the tube or ovary.  LET YOUR CAREGIVER KNOW ABOUT:  Allergies to food or medications.   All the medications you are taking including prescription and over-the-counter herbs, eye drops and creams.   If you are using illegal drugs or excessive alcohol.   Your smoking habits.   Previous problems with anesthesia including numbing medication.   The possibility of being pregnant.   History of blood clots or bleeding problems.   Previous surgery.   Any other medical or health problems.  RISKS AND COMPLICATIONS  All surgery is associated with risks. Some of these risks are:  Injury to surrounding organs.   Bleeding.   Infection.   Blood clots in the legs or lungs.   Problems with the anesthesia.   The surgery does not help the problem.   Death.  BEFORE THE PROCEDURE  Do not take aspirin or blood thinners because it can make you bleed.   Do not eat or drink anything at least 8 hours before the surgery.   Let your caregiver know if you develop  a cold or an infection.   If you are being admitted the day of surgery, arrive at least one hour before the surgery.   Arrange for help when you go home from the hospital.   If you smoke, do not smoke for at least 2 weeks before the surgery.  PROCEDURE After being admitted to the hospital, you will change into a hospital gown. Then, you will be given an IV (intravenous) and a medication to relax you. Then, you will be put to sleep with an anesthetic. Any hair on your lower belly (abdomen) will be removed, and a catheter will be placed in your bladder. The fallopian tube and ovary will be removed either through 2 very small cuts (incisions) or through large incision in the lower abdomen. The blood vessels will be clamped and tied. AFTER THE PROCEDURE  You will be taken to the recovery room for 1 to 3 hours until your blood pressure, pulse and temperature are stable and you are waking up.   If you had a laparoscopy, you may be discharged in several hours.   If you had a large incision, you will be admitted to the hospital for a day or two.   If you had a laparoscopy, you may have shoulder pain. This is not unusual. It is from air that is left in the abdomen and affects the nerve that goes from the diaphragm to the shoulder. It goes away in a day or two.   You will be given pain medication as necessary.   The intravenous and catheter will be removed before you are discharged.   Have someone available to take you home.  HOME CARE INSTRUCTIONS   It is normal to be sore for a week or two. Call your caregiver if the pain is getting worse or the pain medication is not helping.   Have help when you go home for a week or so to help with the household chores.   Follow your caregiver's advice regarding diet.   Get rest and sleep.   Only take over-the-counter or prescription medicines for pain or discomfort as directed by your caregiver.   Do not take aspirin. It can cause bleeding.   Do not  drive, exercise or lift anything over 5 pounds.   Do not drink alcohol until your caregiver gives you permission.   Do not lift anything over 5 pounds.   Do not have sexual  intercourse until your caregiver says it is OK.   Take your temperature twice a day and write it down.   Change the bandage (dressing) as directed.   Make and keep your follow-up appointments for postoperative care.   If you become constipated, ask your caregiver about taking a mild laxative. Drinking more liquids than usual and eating bran foods can help prevent constipation.  SEEK MEDICAL CARE IF:   You have swelling or redness around the cut (incision).   You develop a rash.   You have side effects from the medication.   You feel lightheaded.   You need more or stronger medication.   You have pain, swelling or redness where the IV (intravenous) was placed.  SEEK IMMEDIATE MEDICAL CARE IF:   You develop an unexplained temperature above 100 F (37.8 C).   You develop increasing belly (abdominal) pain.   You have pus coming out of the incision.   You notice a bad smell coming from the wound or dressing.   The incision is separating.   There is excessive vaginal bleeding.   You start to feel sick to your stomach (nauseous) and vomit.   You have leg or chest pain.   You have pain when you urinate.   You develop shortness of breath.   You pass out.  Document Released: 11/06/2008 Document Revised: 12/29/2010 Document Reviewed: 11/06/2008 [facility] Patient Information 2012 [place], [place].

## 2011-05-01 NOTE — Op Note (Signed)
Date of operation: 05-01-2011 Preoperative diagnoses: Bilateral complex ovarian masses. Postoperative diagnoses: Bilateral complex ovarian masses. Operation: Diagnostic laparoscopy with bilateral salpingo-oophorectomy Surgeon: Dr. Eda Paschal First Asst.: Dr. Audie Box  Findings: Patient had bilateral ovarian masses. The one on the right was approximately 5 cm and showed both cystic and solid elements. There were no excrescences. There were no adhesions. The one on the left was approximately 3-4 cm. There were no excrescences. There were no adhesions. The rest of the exploration of the pelvis and abdomen was normal. Patient had a normal appendix.  Procedure: After adequate general endotracheal anesthesia the patient was placed in the dorsolithotomy position. She was prepped and draped in usual sterile manner. A Foley catheter was inserted in her bladder. A sponge stick was placed in the vagina. A subumbilical vertical midline incision was made. Using the Optiview and a 10 mm diagnostic laparoscope the peritoneal cavity was entered atraumatically watching as it went through the fascia and peritoneum. 2 additional incisions were made in the right and left lower quadrant and 2 5 mm ports were placed. The pelvis was visualized and was as noted above. Peritoneal washings were obtained and sent to pathology for tissue diagnosis. The right adnexa was elevated and the ureter was identified. The infundibulopelvic ligament was coagulated and cut with the Enseal. The remainder of the broad ligament, utero-ovarian ligament, and the round ligament were coagulated and cut completely freeing up the right adnexa. The left adnexa was then elevated and the ureter was identified. All the connections of the adnexa to the broad ligament, round ligament, and utero-ovarian ligament were coagulated and cut with the Enseal. There was no bleeding. A 5 mm laparoscope was placed in the right lower quadrant incision. An Endopouch was placed  through the top incision. The 2 specimens were removed intact. They were separated and sent to pathology for tissue diagnosis. The 10 mm laparoscope was replaced. The was no bleeding noted. Copious irrigation was done with sterile saline. All cul-de-sac fluid was removed. All trochars were removed. The subumbilical fascial incision was closed with a figure-of-eight of 0 Vicryl. The subumbilical skin incision was closed with 3-0 Monocryl. The 2 lower incisions were closed with Dermabond. The Foley catheter was removed. The urine was clear. The patient left the operating room in satisfactory condition.

## 2011-05-16 ENCOUNTER — Encounter: Payer: Self-pay | Admitting: Obstetrics and Gynecology

## 2011-05-16 ENCOUNTER — Ambulatory Visit (INDEPENDENT_AMBULATORY_CARE_PROVIDER_SITE_OTHER): Payer: BC Managed Care – PPO | Admitting: Obstetrics and Gynecology

## 2011-05-16 VITALS — BP 138/80

## 2011-05-16 DIAGNOSIS — D279 Benign neoplasm of unspecified ovary: Secondary | ICD-10-CM

## 2011-05-16 NOTE — Progress Notes (Signed)
Patient came back today for a postoperative visit. She's made an uneventful recovery without any problems. 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  Exam: Kennon Portela  Present. Abdomen is soft without guarding rebound or masses. Incisions are all healing well. Pelvic exam within normal limits status post vaginal hysterectomy.  Assessment: Bilateral benign serous cystadenofibromas.  Plan: patient reassured. Surgery described. Pathology report given. Discussed her abnormal EKG. Dr. Ivery Quale had done one previously and then referred her for cardiology evaluation. All was normal. Patient given a copy of the EKG done the day of her surgery and she will show it to Dr. Jarold Motto.

## 2011-06-29 DIAGNOSIS — I10 Essential (primary) hypertension: Secondary | ICD-10-CM | POA: Diagnosis not present

## 2011-06-29 DIAGNOSIS — K7689 Other specified diseases of liver: Secondary | ICD-10-CM | POA: Diagnosis not present

## 2011-07-14 DIAGNOSIS — K7689 Other specified diseases of liver: Secondary | ICD-10-CM | POA: Diagnosis not present

## 2011-08-30 DIAGNOSIS — Z1231 Encounter for screening mammogram for malignant neoplasm of breast: Secondary | ICD-10-CM | POA: Diagnosis not present

## 2011-08-31 ENCOUNTER — Encounter: Payer: Self-pay | Admitting: Obstetrics and Gynecology

## 2011-11-15 DIAGNOSIS — E538 Deficiency of other specified B group vitamins: Secondary | ICD-10-CM | POA: Diagnosis not present

## 2011-11-15 DIAGNOSIS — M949 Disorder of cartilage, unspecified: Secondary | ICD-10-CM | POA: Diagnosis not present

## 2011-11-15 DIAGNOSIS — I10 Essential (primary) hypertension: Secondary | ICD-10-CM | POA: Diagnosis not present

## 2011-11-15 DIAGNOSIS — R9431 Abnormal electrocardiogram [ECG] [EKG]: Secondary | ICD-10-CM | POA: Diagnosis not present

## 2011-11-22 DIAGNOSIS — Z1331 Encounter for screening for depression: Secondary | ICD-10-CM | POA: Diagnosis not present

## 2011-11-22 DIAGNOSIS — M25519 Pain in unspecified shoulder: Secondary | ICD-10-CM | POA: Diagnosis not present

## 2011-11-22 DIAGNOSIS — M722 Plantar fascial fibromatosis: Secondary | ICD-10-CM | POA: Diagnosis not present

## 2011-11-22 DIAGNOSIS — I10 Essential (primary) hypertension: Secondary | ICD-10-CM | POA: Diagnosis not present

## 2011-11-22 DIAGNOSIS — Z0000 Encounter for general adult medical examination without abnormal findings: Secondary | ICD-10-CM | POA: Diagnosis not present

## 2011-11-24 DIAGNOSIS — Z1212 Encounter for screening for malignant neoplasm of rectum: Secondary | ICD-10-CM | POA: Diagnosis not present

## 2012-01-12 ENCOUNTER — Encounter: Payer: Self-pay | Admitting: Obstetrics and Gynecology

## 2012-01-12 ENCOUNTER — Ambulatory Visit (INDEPENDENT_AMBULATORY_CARE_PROVIDER_SITE_OTHER): Payer: Medicare Other | Admitting: Obstetrics and Gynecology

## 2012-01-12 VITALS — BP 130/80 | Ht 66.0 in | Wt 162.0 lb

## 2012-01-12 DIAGNOSIS — N838 Other noninflammatory disorders of ovary, fallopian tube and broad ligament: Secondary | ICD-10-CM

## 2012-01-12 DIAGNOSIS — M8580 Other specified disorders of bone density and structure, unspecified site: Secondary | ICD-10-CM

## 2012-01-12 DIAGNOSIS — N9489 Other specified conditions associated with female genital organs and menstrual cycle: Secondary | ICD-10-CM

## 2012-01-12 DIAGNOSIS — N952 Postmenopausal atrophic vaginitis: Secondary | ICD-10-CM

## 2012-01-12 DIAGNOSIS — M899 Disorder of bone, unspecified: Secondary | ICD-10-CM

## 2012-01-12 DIAGNOSIS — R1013 Epigastric pain: Secondary | ICD-10-CM | POA: Diagnosis not present

## 2012-01-12 DIAGNOSIS — M949 Disorder of cartilage, unspecified: Secondary | ICD-10-CM | POA: Diagnosis not present

## 2012-01-12 DIAGNOSIS — I10 Essential (primary) hypertension: Secondary | ICD-10-CM | POA: Insufficient documentation

## 2012-01-12 NOTE — Progress Notes (Signed)
Patient came back to see me today for further followup. She is having midepigastric pain. She feels like her stomach is full. It is associated with constipation. It's been going on now for a significant period of time greater than 6 months. She is having no nausea with it. She is having no urinary symptoms specifically no dysuria, frequency, urgency or hematuria. In 1990 she had a vaginal hysterectomy for fibroids with menometrorrhagia and anemia. In the spring of 2013 she had a laparoscopic bilateral salpingo-oophorectomy for bilateral serous cystadenofibromas and bilateral endosalpingiosis. She has had normal Pap smears her entire life. Her last Pap smear was 2012. She does have osteopenia on bone density. She has had no fractures. She took Fosamax for 5-1/2 years and went  on drug holiday in 2009. Her last bone density in  2010 showed worsening osteopenia position and she was  placed back on Actonel. She took it for approximately one year and stopped it although she was not having side effects. She is not a good pill taker she said. She is having no vaginal bleeding. Her abdominal pain is not pelvic. She does have vaginal dryness but does not need medication.  ROS: 12 system review done. Pertinent positives above. Other positive is  Hypertension.  HEENT: Within normal limits.Kennon Portela present.  Neck: No masses. Supraclavicular lymph nodes: Not enlarged. Breasts: Examined in both sitting and lying position. Symmetrical without skin changes or masses. Abdomen: Soft no masses guarding or rebound. No hernias. Pelvic: External within normal limits. BUS within normal limits. Vaginal examination shows poor estrogen effect, no cystocele enterocele or rectocele. Cervix and uterus absent. Adnexa within normal limits. Rectovaginal confirmatory. Extremities within normal limits.  Assessment: #1. Osteopenia #2. Atrophic vaginitis #3. Midepigastric pain #4. Endosalpingosis  Plan: Continue yearly mammograms.  Bone density. Consult with Dr. Jarold Motto regarding pain. Pap not done.The new Pap smear guidelines were discussed with the patient.

## 2012-01-12 NOTE — Patient Instructions (Addendum)
Schedule  bone density. Continue yearly mammograms. 

## 2012-05-27 DIAGNOSIS — J029 Acute pharyngitis, unspecified: Secondary | ICD-10-CM | POA: Diagnosis not present

## 2012-05-27 DIAGNOSIS — I10 Essential (primary) hypertension: Secondary | ICD-10-CM | POA: Diagnosis not present

## 2012-05-27 DIAGNOSIS — Z6827 Body mass index (BMI) 27.0-27.9, adult: Secondary | ICD-10-CM | POA: Diagnosis not present

## 2012-07-18 ENCOUNTER — Encounter: Payer: Self-pay | Admitting: Internal Medicine

## 2012-07-19 ENCOUNTER — Encounter: Payer: Self-pay | Admitting: Internal Medicine

## 2012-09-05 ENCOUNTER — Ambulatory Visit (AMBULATORY_SURGERY_CENTER): Payer: BC Managed Care – PPO

## 2012-09-05 VITALS — Ht 66.0 in | Wt 163.2 lb

## 2012-09-05 DIAGNOSIS — Z860100 Personal history of colon polyps, unspecified: Secondary | ICD-10-CM

## 2012-09-05 MED ORDER — SUPREP BOWEL PREP KIT 17.5-3.13-1.6 GM/177ML PO SOLN: 1.0000 | Freq: Once | ORAL | Status: DC

## 2012-09-19 ENCOUNTER — Encounter: Payer: Self-pay | Admitting: Internal Medicine

## 2012-09-19 ENCOUNTER — Ambulatory Visit (AMBULATORY_SURGERY_CENTER): Payer: BC Managed Care – PPO | Admitting: Internal Medicine

## 2012-09-19 VITALS — BP 118/61 | HR 50 | Temp 97.7°F | Resp 21 | Ht 66.0 in | Wt 163.0 lb

## 2012-09-19 DIAGNOSIS — Z800 Family history of malignant neoplasm of digestive organs: Secondary | ICD-10-CM | POA: Diagnosis not present

## 2012-09-19 DIAGNOSIS — D126 Benign neoplasm of colon, unspecified: Secondary | ICD-10-CM

## 2012-09-19 DIAGNOSIS — I10 Essential (primary) hypertension: Secondary | ICD-10-CM | POA: Diagnosis not present

## 2012-09-19 DIAGNOSIS — Z1211 Encounter for screening for malignant neoplasm of colon: Secondary | ICD-10-CM | POA: Diagnosis not present

## 2012-09-19 DIAGNOSIS — K648 Other hemorrhoids: Secondary | ICD-10-CM

## 2012-09-19 MED ORDER — SODIUM CHLORIDE 0.9 % IV SOLN: 500.0000 mL | INTRAVENOUS | Status: DC

## 2012-09-19 NOTE — Progress Notes (Signed)
Procedure ends, to recovery, report given and VSS. 

## 2012-09-19 NOTE — Op Note (Signed)
[facility] [address] [place], [address]   COLONOSCOPY PROCEDURE REPORT  PATIENT: Nancy Beck, Nancy Beck  MR#: [id number] BIRTHDATE: 08-15-1945 , 67  yrs. old GENDER: Female ENDOSCOPIST: Iva Boop, MD, [facility] PROCEDURE DATE:  09/19/2012 PROCEDURE:   Colonoscopy with biopsy and snare polypectomy First Screening Colonoscopy - Avg.  risk and is 50 yrs.  old or older - No.  Prior Negative Screening - Now for repeat screening. Above average risk  History of Adenoma - Now for follow-up colonoscopy & has been > or = to 3 yrs.  N/A  Polyps Removed Today? Yes. ASA CLASS:   Class II INDICATIONS:Patient's immediate family history of colon cancer (brother in 82's) and Last colonoscopy performed 5 years ago. MEDICATIONS: Propofol (Diprivan) 180 mg IV, MAC sedation, administered by CRNA, and These medications were titrated to patient response per physician's verbal order  DESCRIPTION OF PROCEDURE:   After the risks benefits and alternatives of the procedure were thoroughly explained, informed consent was obtained.  A digital rectal exam revealed no abnormalities of the rectum.   The LB [id number]  endoscope was introduced through the anus and advanced to the cecum, which was identified by both the appendix and ileocecal valve. No adverse events experienced.   The quality of the prep was excellent using Suprep  The instrument was then slowly withdrawn as the colon was fully examined.  COLON FINDINGS: Four sessile polyps measuring 2-5 mm in size were found in the ascending colon, transverse colon, and sigmoid colon. A polypectomy was performed with cold forceps (2 polyps) and with a cold snare (2 polyps).  The resection was complete and the polyp tissue was completely retrieved.   The colon mucosa was otherwise normal.   A right colon retroflexion was performed.  Retroflexed views revealed internal hemorrhoids. The time to cecum=3 minutes 15 seconds.  Withdrawal  time=11 minutes 32 seconds.  The scope was withdrawn and the procedure completed. COMPLICATIONS: There were no complications.  ENDOSCOPIC IMPRESSION: 1.   Four sessile polyps measuring 2-5 mm in size were found in the ascending colon, transverse colon, and sigmoid colon; polypectomy was performed with cold forceps and with a cold snare 2.   The colon mucosa was otherwise normal - excellent prep - she has family hx (brother) colon cancer. She has had polyps but they were not neoplastic. 3.   Internal hemorrhoids in rectum  RECOMMENDATIONS: 1.  Timing of repeat colonoscopy will be determined by pathology findings. 2.   Consider hemorrhoid banding if she has frequent/bothersome symptoms.  I have provided a handout about this.   eSigned:  Iva Boop, MD, [facility] 09/19/2012 12:23 PM  cc: Jarome Matin, MD and The Patient

## 2012-09-19 NOTE — Progress Notes (Signed)
Patient did not have preoperative order for IV antibiotic SSI prophylaxis. (G8918)  Patient did not experience any of the following events: a burn prior to discharge; a fall within the facility; wrong site/side/patient/procedure/implant event; or a hospital transfer or hospital admission upon discharge from the facility. (G8907)  

## 2012-09-19 NOTE — Patient Instructions (Addendum)
I found and removed 4 tiny polyps that all look benign.  You also have some hemorrhoids - If you have hemorrhoid problems (swelling, itching, bleeding) I am able to treat those with an in-office procedure. If you like, please call my office at [phone] to schedule an appointment and I can evaluate you further.  I will let you know pathology results and when to have another routine colonoscopy by mail.  I appreciate the opportunity to care for you. Iva Boop, MD, [facility]   Polyp and hemorrhoid information given.  Next colonoscopy will be scheduled after Dr. Leone Payor reviews your pathology report. YOU HAD AN ENDOSCOPIC PROCEDURE TODAY AT THE  ENDOSCOPY CENTER: Refer to the procedure report that was given to you for any specific questions about what was found during the examination.  If the procedure report does not answer your questions, please call your gastroenterologist to clarify.  If you requested that your care partner not be given the details of your procedure findings, then the procedure report has been included in a sealed envelope for you to review at your convenience later.  YOU SHOULD EXPECT: Some feelings of bloating in the abdomen. Passage of more gas than usual.  Walking can help get rid of the air that was put into your GI tract during the procedure and reduce the bloating. If you had a lower endoscopy (such as a colonoscopy or flexible sigmoidoscopy) you may notice spotting of blood in your stool or on the toilet paper. If you underwent a bowel prep for your procedure, then you may not have a normal bowel movement for a few days.  DIET: Your first meal following the procedure should be a light meal and then it is ok to progress to your normal diet.  A half-sandwich or bowl of soup is an example of a good first meal.  Heavy or fried foods are harder to digest and may make you feel nauseous or bloated.  Likewise meals heavy in dairy and vegetables can cause extra gas to form  and this can also increase the bloating.  Drink plenty of fluids but you should avoid alcoholic beverages for 24 hours.  ACTIVITY: Your care partner should take you home directly after the procedure.  You should plan to take it easy, moving slowly for the rest of the day.  You can resume normal activity the day after the procedure however you should NOT DRIVE or use heavy machinery for 24 hours (because of the sedation medicines used during the test).    SYMPTOMS TO REPORT IMMEDIATELY: A gastroenterologist can be reached at any hour.  During normal business hours, 8:30 AM to 5:00 PM Monday through Friday, call [phone].  After hours and on weekends, please call the GI answering service at [phone] who will take a message and have the physician on call contact you.   Following lower endoscopy (colonoscopy or flexible sigmoidoscopy):  Excessive amounts of blood in the stool  Significant tenderness or worsening of abdominal pains  Swelling of the abdomen that is new, acute  Fever of 100F or higher ls  FOLLOW UP: If any biopsies were taken you will be contacted by phone or by letter within the next 1-3 weeks.  Call your gastroenterologist if you have not heard about the biopsies in 3 weeks.  Our staff will call the home number listed on your records the next business day following your procedure to check on you and address any questions or concerns that you  may have at that time regarding the information given to you following your procedure. This is a courtesy call and so if there is no answer at the home number and we have not heard from you through the emergency physician on call, we will assume that you have returned to your regular daily activities without incident.  SIGNATURES/CONFIDENTIALITY: You and/or your care partner have signed paperwork which will be entered into your electronic medical record.  These signatures attest to the fact that that the information above on your  After Visit Summary has been reviewed and is understood.  Full responsibility of the confidentiality of this discharge information lies with you and/or your care-partner.    Polyp and hemorrhoid information   Dr. Leone Payor will advise you about your next colonoscopy after he reviews pathology report.

## 2012-09-19 NOTE — Progress Notes (Addendum)
Called to room to assist during endoscopic procedure.  Patient ID and intended procedure confirmed with present staff. Received instructions for my participation in the procedure from the performing physician.  

## 2012-09-20 ENCOUNTER — Telehealth: Payer: Self-pay

## 2012-09-20 NOTE — Telephone Encounter (Signed)
  Follow up Call-  Call back number 09/19/2012  Post procedure Call Back phone  # [phone] hm  Permission to leave phone message Yes     Patient questions:  Do you have a fever, pain , or abdominal swelling? no Pain Score  0 *  Have you tolerated food without any problems? yes  Have you been able to return to your normal activities? yes  Do you have any questions about your discharge instructions: Diet   no Medications  no Follow up visit  no  Do you have questions or concerns about your Care? no  Actions: * If pain score is 4 or above: No action needed, pain <4.

## 2012-09-25 DIAGNOSIS — Z1231 Encounter for screening mammogram for malignant neoplasm of breast: Secondary | ICD-10-CM | POA: Diagnosis not present

## 2012-09-27 ENCOUNTER — Encounter: Payer: Self-pay | Admitting: Internal Medicine

## 2012-09-27 DIAGNOSIS — Z860100 Personal history of colon polyps, unspecified: Secondary | ICD-10-CM | POA: Insufficient documentation

## 2012-09-27 NOTE — Progress Notes (Signed)
Quick Note:  One diminutive adenoma Repeat colonoscopy 2019 ______ 

## 2012-11-19 DIAGNOSIS — E538 Deficiency of other specified B group vitamins: Secondary | ICD-10-CM | POA: Diagnosis not present

## 2012-11-20 DIAGNOSIS — Z0000 Encounter for general adult medical examination without abnormal findings: Secondary | ICD-10-CM | POA: Diagnosis not present

## 2012-11-20 DIAGNOSIS — M25519 Pain in unspecified shoulder: Secondary | ICD-10-CM | POA: Diagnosis not present

## 2012-11-20 DIAGNOSIS — I10 Essential (primary) hypertension: Secondary | ICD-10-CM | POA: Diagnosis not present

## 2012-11-20 DIAGNOSIS — E538 Deficiency of other specified B group vitamins: Secondary | ICD-10-CM | POA: Diagnosis not present

## 2012-11-20 DIAGNOSIS — M899 Disorder of bone, unspecified: Secondary | ICD-10-CM | POA: Diagnosis not present

## 2012-11-27 DIAGNOSIS — Z6826 Body mass index (BMI) 26.0-26.9, adult: Secondary | ICD-10-CM | POA: Diagnosis not present

## 2012-11-27 DIAGNOSIS — I10 Essential (primary) hypertension: Secondary | ICD-10-CM | POA: Diagnosis not present

## 2012-11-27 DIAGNOSIS — Z1331 Encounter for screening for depression: Secondary | ICD-10-CM | POA: Diagnosis not present

## 2012-11-27 DIAGNOSIS — M899 Disorder of bone, unspecified: Secondary | ICD-10-CM | POA: Diagnosis not present

## 2012-11-27 DIAGNOSIS — Z0000 Encounter for general adult medical examination without abnormal findings: Secondary | ICD-10-CM | POA: Diagnosis not present

## 2012-11-27 DIAGNOSIS — E538 Deficiency of other specified B group vitamins: Secondary | ICD-10-CM | POA: Diagnosis not present

## 2012-11-28 DIAGNOSIS — Z1212 Encounter for screening for malignant neoplasm of rectum: Secondary | ICD-10-CM | POA: Diagnosis not present

## 2012-12-27 DIAGNOSIS — H2510 Age-related nuclear cataract, unspecified eye: Secondary | ICD-10-CM | POA: Diagnosis not present

## 2013-01-13 ENCOUNTER — Ambulatory Visit (INDEPENDENT_AMBULATORY_CARE_PROVIDER_SITE_OTHER): Payer: Medicare Other | Admitting: Gynecology

## 2013-01-13 ENCOUNTER — Encounter: Payer: BC Managed Care – PPO | Admitting: Gynecology

## 2013-01-13 ENCOUNTER — Encounter: Payer: Self-pay | Admitting: Gynecology

## 2013-01-13 VITALS — BP 160/92 | Ht 66.25 in | Wt 162.4 lb

## 2013-01-13 DIAGNOSIS — N6089 Other benign mammary dysplasias of unspecified breast: Secondary | ICD-10-CM

## 2013-01-13 DIAGNOSIS — M899 Disorder of bone, unspecified: Secondary | ICD-10-CM | POA: Diagnosis not present

## 2013-01-13 DIAGNOSIS — N952 Postmenopausal atrophic vaginitis: Secondary | ICD-10-CM | POA: Diagnosis not present

## 2013-01-13 DIAGNOSIS — N6082 Other benign mammary dysplasias of left breast: Secondary | ICD-10-CM

## 2013-01-13 DIAGNOSIS — M8580 Other specified disorders of bone density and structure, unspecified site: Secondary | ICD-10-CM

## 2013-01-13 NOTE — Patient Instructions (Signed)
Followup in one year for annual exam, sooner as needed. 

## 2013-01-13 NOTE — Progress Notes (Signed)
Nancy Beck [birth year] [id number]        67 y.o.  [id number] for followup exam.  Former patient Dr. Eda Paschal. Several issues noted below.  Past medical history,surgical history, problem list, medications, allergies, family history and social history were all reviewed and documented in the EPIC chart.  ROS:  Performed and pertinent positives and negatives are included in the history, assessment and plan .  Exam: Sherrilyn Rist assistant Filed Vitals:   01/13/13 1357  BP: 160/92  Height: 5' 6.25" (1.683 m)  Weight: 162 lb 6.4 oz (73.664 kg)   General appearance  Normal Skin grossly normal Head/Neck normal with no cervical or supraclavicular adenopathy thyroid normal Lungs  clear Cardiac RR, without RMG Abdominal  soft, nontender, without masses, organomegaly or hernia Breasts  examined lying and sitting without masses, retractions, discharge or axillary adenopathy. Small classic-appearing sebaceous cyst left tail of Spence/anterior axillary line with overlying blackhead. Pelvic  Ext/BUS/vagina  Normal with atrophic genital changes  Adnexa  Without masses or tenderness    Anus and perineum  Normal   Rectovaginal  Normal sphincter tone without palpated masses or tenderness.    Assessment/Plan:  67 y.o. [id number] female for annual exam.   1. Postmenopausal/atrophic genital changes. Patient is status post [facility] 1990 for leiomyoma and menorrhagia. Underwent laparoscopic BSO 2013 for bilateral serous cystadenofibromas. Is not having any significant menopausal symptoms of hot flushes night sweats vaginal dryness or dyspareunia. Will continue to monitor. 2. Osteopenia. DEXA 2010 T score of -2.2. Patient reports that she thinks that she have another bone density at the same facility since then. History of Fosamax for 5-1/2 years. Went on drug holiday 2009. Went back on Actonel due to worsening bone loss 2010 and this for approximately one year but then stopped it. Is on extra calcium and vitamin D. Will  verify recent bone density and if not then we'll schedule. 3. Pap smear 2012. No Pap smear done today. No history of abnormal Pap smears previously. Status post hysterectomy for benign indications. Is over the age of 18. Options to stop screening versus less frequent screening intervals reviewed. Will readdress on an annual basis. 4. Mammography 08/2012 by her history. Continue with annual mammography. Small classic sebaceous cyst left tail of Spence. Patient will monitor and as long as it remains unchanged or resolves then will follow. SBE monthly review. 5. Colonoscopy 08/2012. Repeat at their recommended interval. 6. Health maintenance. No blood work done as this is done through her primary physician's office. Blood pressure 160/92 noted to the patient. She admits to not taking her blood pressure medicine today and will be more diligent. Followup one year, sooner as needed.  Note: This document was prepared with digital dictation and possible smart phrase technology. Any transcriptional errors that result from this process are unintentional.   Dara Lords MD, 2:27 PM 01/13/2013

## 2013-01-14 ENCOUNTER — Other Ambulatory Visit: Payer: Self-pay | Admitting: *Deleted

## 2013-01-14 DIAGNOSIS — M8580 Other specified disorders of bone density and structure, unspecified site: Secondary | ICD-10-CM

## 2013-01-14 LAB — URINALYSIS W MICROSCOPIC + REFLEX CULTURE
Casts: NONE SEEN
Hgb urine dipstick: NEGATIVE
Leukocytes, UA: NEGATIVE
Nitrite: NEGATIVE
Protein, ur: NEGATIVE mg/dL
Urobilinogen, UA: 0.2 mg/dL (ref 0.0–1.0)
pH: 6 (ref 5.0–8.0)

## 2013-01-15 ENCOUNTER — Telehealth: Payer: Self-pay | Admitting: Gynecology

## 2013-01-15 NOTE — Telephone Encounter (Signed)
FRONT DESK LEFT MESSAGE FOR PT TO CALL

## 2013-01-15 NOTE — Telephone Encounter (Signed)
Tell patient that her last bone density was 2010. Recommend scheduling DEXA here now.

## 2013-01-27 NOTE — Telephone Encounter (Signed)
appt 03/18/13 for dexa

## 2013-04-23 DIAGNOSIS — M8580 Other specified disorders of bone density and structure, unspecified site: Secondary | ICD-10-CM

## 2013-04-23 HISTORY — DX: Other specified disorders of bone density and structure, unspecified site: M85.80

## 2013-05-06 ENCOUNTER — Ambulatory Visit (INDEPENDENT_AMBULATORY_CARE_PROVIDER_SITE_OTHER): Payer: Medicare Other

## 2013-05-06 DIAGNOSIS — M8580 Other specified disorders of bone density and structure, unspecified site: Secondary | ICD-10-CM

## 2013-05-06 DIAGNOSIS — R059 Cough, unspecified: Secondary | ICD-10-CM | POA: Diagnosis not present

## 2013-05-06 DIAGNOSIS — M899 Disorder of bone, unspecified: Secondary | ICD-10-CM

## 2013-05-06 DIAGNOSIS — R05 Cough: Secondary | ICD-10-CM | POA: Diagnosis not present

## 2013-05-06 DIAGNOSIS — Z6825 Body mass index (BMI) 25.0-25.9, adult: Secondary | ICD-10-CM | POA: Diagnosis not present

## 2013-05-06 DIAGNOSIS — M949 Disorder of cartilage, unspecified: Secondary | ICD-10-CM

## 2013-05-06 DIAGNOSIS — R509 Fever, unspecified: Secondary | ICD-10-CM | POA: Diagnosis not present

## 2013-05-06 DIAGNOSIS — I10 Essential (primary) hypertension: Secondary | ICD-10-CM | POA: Diagnosis not present

## 2013-05-06 DIAGNOSIS — J111 Influenza due to unidentified influenza virus with other respiratory manifestations: Secondary | ICD-10-CM | POA: Diagnosis not present

## 2013-05-07 ENCOUNTER — Encounter: Payer: Self-pay | Admitting: Gynecology

## 2013-07-28 DIAGNOSIS — I10 Essential (primary) hypertension: Secondary | ICD-10-CM | POA: Diagnosis not present

## 2013-07-28 DIAGNOSIS — L2089 Other atopic dermatitis: Secondary | ICD-10-CM | POA: Diagnosis not present

## 2013-07-28 DIAGNOSIS — Z6826 Body mass index (BMI) 26.0-26.9, adult: Secondary | ICD-10-CM | POA: Diagnosis not present

## 2013-10-09 ENCOUNTER — Encounter: Payer: Self-pay | Admitting: Internal Medicine

## 2013-11-11 DIAGNOSIS — Z1231 Encounter for screening mammogram for malignant neoplasm of breast: Secondary | ICD-10-CM | POA: Diagnosis not present

## 2013-11-11 DIAGNOSIS — Z803 Family history of malignant neoplasm of breast: Secondary | ICD-10-CM | POA: Diagnosis not present

## 2013-11-12 ENCOUNTER — Encounter: Payer: Self-pay | Admitting: Gynecology

## 2013-11-24 ENCOUNTER — Encounter: Payer: Self-pay | Admitting: Gynecology

## 2013-11-28 DIAGNOSIS — Z008 Encounter for other general examination: Secondary | ICD-10-CM | POA: Diagnosis not present

## 2013-11-28 DIAGNOSIS — E538 Deficiency of other specified B group vitamins: Secondary | ICD-10-CM | POA: Diagnosis not present

## 2013-11-28 DIAGNOSIS — I10 Essential (primary) hypertension: Secondary | ICD-10-CM | POA: Diagnosis not present

## 2013-11-28 DIAGNOSIS — M8580 Other specified disorders of bone density and structure, unspecified site: Secondary | ICD-10-CM | POA: Diagnosis not present

## 2013-11-28 DIAGNOSIS — M859 Disorder of bone density and structure, unspecified: Secondary | ICD-10-CM | POA: Diagnosis not present

## 2013-12-05 DIAGNOSIS — M8580 Other specified disorders of bone density and structure, unspecified site: Secondary | ICD-10-CM | POA: Diagnosis not present

## 2013-12-05 DIAGNOSIS — Z6825 Body mass index (BMI) 25.0-25.9, adult: Secondary | ICD-10-CM | POA: Diagnosis not present

## 2013-12-05 DIAGNOSIS — E538 Deficiency of other specified B group vitamins: Secondary | ICD-10-CM | POA: Diagnosis not present

## 2013-12-05 DIAGNOSIS — Z1389 Encounter for screening for other disorder: Secondary | ICD-10-CM | POA: Diagnosis not present

## 2013-12-05 DIAGNOSIS — Z008 Encounter for other general examination: Secondary | ICD-10-CM | POA: Diagnosis not present

## 2013-12-05 DIAGNOSIS — E559 Vitamin D deficiency, unspecified: Secondary | ICD-10-CM | POA: Diagnosis not present

## 2013-12-05 DIAGNOSIS — I10 Essential (primary) hypertension: Secondary | ICD-10-CM | POA: Diagnosis not present

## 2013-12-10 DIAGNOSIS — Z1212 Encounter for screening for malignant neoplasm of rectum: Secondary | ICD-10-CM | POA: Diagnosis not present

## 2014-01-14 ENCOUNTER — Encounter: Payer: Self-pay | Admitting: Gynecology

## 2014-01-14 ENCOUNTER — Ambulatory Visit (INDEPENDENT_AMBULATORY_CARE_PROVIDER_SITE_OTHER): Payer: Medicare Other | Admitting: Gynecology

## 2014-01-14 VITALS — BP 118/72 | Ht 66.0 in | Wt 163.0 lb

## 2014-01-14 DIAGNOSIS — M8580 Other specified disorders of bone density and structure, unspecified site: Secondary | ICD-10-CM

## 2014-01-14 DIAGNOSIS — E559 Vitamin D deficiency, unspecified: Secondary | ICD-10-CM

## 2014-01-14 DIAGNOSIS — N952 Postmenopausal atrophic vaginitis: Secondary | ICD-10-CM

## 2014-01-14 NOTE — Patient Instructions (Signed)
You may obtain a copy of any labs that were done today by logging onto MyChart as outlined in the instructions provided with your AVS (after visit summary). The office will not call with normal lab results but certainly if there are any significant abnormalities then we will contact you.   Health Maintenance, Female A healthy lifestyle and preventative care can promote health and wellness.  Maintain regular health, dental, and eye exams.  Eat a healthy diet. Foods like vegetables, fruits, whole grains, low-fat dairy products, and lean protein foods contain the nutrients you need without too many calories. Decrease your intake of foods high in solid fats, added sugars, and salt. Get information about a proper diet from your caregiver, if necessary.  Regular physical exercise is one of the most important things you can do for your health. Most adults should get at least 150 minutes of moderate-intensity exercise (any activity that increases your heart rate and causes you to sweat) each week. In addition, most adults need muscle-strengthening exercises on 2 or more days a week.   Maintain a healthy weight. The body mass index (BMI) is a screening tool to identify possible weight problems. It provides an estimate of body fat based on height and weight. Your caregiver can help determine your BMI, and can help you achieve or maintain a healthy weight. For adults 20 years and older:  A BMI below 18.5 is considered underweight.  A BMI of 18.5 to 24.9 is normal.  A BMI of 25 to 29.9 is considered overweight.  A BMI of 30 and above is considered obese.  Maintain normal blood lipids and cholesterol by exercising and minimizing your intake of saturated fat. Eat a balanced diet with plenty of fruits and vegetables. Blood tests for lipids and cholesterol should begin at age 61 and be repeated every 5 years. If your lipid or cholesterol levels are high, you are over 50, or you are a high risk for heart  disease, you may need your cholesterol levels checked more frequently.Ongoing high lipid and cholesterol levels should be treated with medicines if diet and exercise are not effective.  If you smoke, find out from your caregiver how to quit. If you do not use tobacco, do not start.  Lung cancer screening is recommended for adults aged 33 80 years who are at high risk for developing lung cancer because of a history of smoking. Yearly low-dose computed tomography (CT) is recommended for people who have at least a 30-pack-year history of smoking and are a current smoker or have quit within the past 15 years. A pack year of smoking is smoking an average of 1 pack of cigarettes a day for 1 year (for example: 1 pack a day for 30 years or 2 packs a day for 15 years). Yearly screening should continue until the smoker has stopped smoking for at least 15 years. Yearly screening should also be stopped for people who develop a health problem that would prevent them from having lung cancer treatment.  If you are pregnant, do not drink alcohol. If you are breastfeeding, be very cautious about drinking alcohol. If you are not pregnant and choose to drink alcohol, do not exceed 1 drink per day. One drink is considered to be 12 ounces (355 mL) of beer, 5 ounces (148 mL) of wine, or 1.5 ounces (44 mL) of liquor.  Avoid use of street drugs. Do not share needles with anyone. Ask for help if you need support or instructions about stopping  the use of drugs.  High blood pressure causes heart disease and increases the risk of stroke. Blood pressure should be checked at least every 1 to 2 years. Ongoing high blood pressure should be treated with medicines, if weight loss and exercise are not effective.  If you are 59 to 67 years old, ask your caregiver if you should take aspirin to prevent strokes.  Diabetes screening involves taking a blood sample to check your fasting blood sugar level. This should be done once every 3  years, after age 91, if you are within normal weight and without risk factors for diabetes. Testing should be considered at a younger age or be carried out more frequently if you are overweight and have at least 1 risk factor for diabetes.  Breast cancer screening is essential preventative care for women. You should practice "breast self-awareness." This means understanding the normal appearance and feel of your breasts and may include breast self-examination. Any changes detected, no matter how small, should be reported to a caregiver. Women in their 66s and 30s should have a clinical breast exam (CBE) by a caregiver as part of a regular health exam every 1 to 3 years. After age 101, women should have a CBE every year. Starting at age 100, women should consider having a mammogram (breast X-ray) every year. Women who have a family history of breast cancer should talk to their caregiver about genetic screening. Women at a high risk of breast cancer should talk to their caregiver about having an MRI and a mammogram every year.  Breast cancer gene (BRCA)-related cancer risk assessment is recommended for women who have family members with BRCA-related cancers. BRCA-related cancers include breast, ovarian, tubal, and peritoneal cancers. Having family members with these cancers may be associated with an increased risk for harmful changes (mutations) in the breast cancer genes BRCA1 and BRCA2. Results of the assessment will determine the need for genetic counseling and BRCA1 and BRCA2 testing.  The Pap test is a screening test for cervical cancer. Women should have a Pap test starting at age 57. Between ages 25 and 35, Pap tests should be repeated every 2 years. Beginning at age 37, you should have a Pap test every 3 years as long as the past 3 Pap tests have been normal. If you had a hysterectomy for a problem that was not cancer or a condition that could lead to cancer, then you no longer need Pap tests. If you are  between ages 50 and 76, and you have had normal Pap tests going back 10 years, you no longer need Pap tests. If you have had past treatment for cervical cancer or a condition that could lead to cancer, you need Pap tests and screening for cancer for at least 20 years after your treatment. If Pap tests have been discontinued, risk factors (such as a new sexual partner) need to be reassessed to determine if screening should be resumed. Some women have medical problems that increase the chance of getting cervical cancer. In these cases, your caregiver may recommend more frequent screening and Pap tests.  The human papillomavirus (HPV) test is an additional test that may be used for cervical cancer screening. The HPV test looks for the virus that can cause the cell changes on the cervix. The cells collected during the Pap test can be tested for HPV. The HPV test could be used to screen women aged 44 years and older, and should be used in women of any age  who have unclear Pap test results. After the age of 55, women should have HPV testing at the same frequency as a Pap test.  Colorectal cancer can be detected and often prevented. Most routine colorectal cancer screening begins at the age of 44 and continues through age 20. However, your caregiver may recommend screening at an earlier age if you have risk factors for colon cancer. On a yearly basis, your caregiver may provide home test kits to check for hidden blood in the stool. Use of a small camera at the end of a tube, to directly examine the colon (sigmoidoscopy or colonoscopy), can detect the earliest forms of colorectal cancer. Talk to your caregiver about this at age 86, when routine screening begins. Direct examination of the colon should be repeated every 5 to 10 years through age 13, unless early forms of pre-cancerous polyps or small growths are found.  Hepatitis C blood testing is recommended for all people born from [birth year] through 1964 and any  individual with known risks for hepatitis C.  Practice safe sex. Use condoms and avoid high-risk sexual practices to reduce the spread of sexually transmitted infections (STIs). Sexually active women aged 36 and younger should be checked for Chlamydia, which is a common sexually transmitted infection. Older women with new or multiple partners should also be tested for Chlamydia. Testing for other STIs is recommended if you are sexually active and at increased risk.  Osteoporosis is a disease in which the bones lose minerals and strength with aging. This can result in serious bone fractures. The risk of osteoporosis can be identified using a bone density scan. Women ages 20 and over and women at risk for fractures or osteoporosis should discuss screening with their caregivers. Ask your caregiver whether you should be taking a calcium supplement or vitamin D to reduce the rate of osteoporosis.  Menopause can be associated with physical symptoms and risks. Hormone replacement therapy is available to decrease symptoms and risks. You should talk to your caregiver about whether hormone replacement therapy is right for you.  Use sunscreen. Apply sunscreen liberally and repeatedly throughout the day. You should seek shade when your shadow is shorter than you. Protect yourself by wearing long sleeves, pants, a wide-brimmed hat, and sunglasses year round, whenever you are outdoors.  Notify your caregiver of new moles or changes in moles, especially if there is a change in shape or color. Also notify your caregiver if a mole is larger than the size of a pencil eraser.  Stay current with your immunizations. Document Released: 07/25/2010 Document Revised: 05/06/2012 Document Reviewed: 07/25/2010 [facility] Patient Information 2014 [employer].

## 2014-01-14 NOTE — Progress Notes (Signed)
[place] 1945/07/10 [id number]        68 y.o.  G2P2002 for follow up exam. Several issues noted below.  Past medical history,surgical history, problem list, medications, allergies, family history and social history were all reviewed and documented as reviewed in the EPIC chart.  ROS:  Performed with pertinent positives and negatives included in the history, assessment and plan.   Additional significant findings :  none   Exam: Kim Counsellor Vitals:   01/14/14 0850  BP: 118/72  Height: 5\' 6"  (1.676 m)  Weight: 163 lb (73.936 kg)   General appearance:  Normal affect, orientation and appearance. Skin: Grossly normal HEENT: Without gross lesions.  No cervical or supraclavicular adenopathy. Thyroid normal.  Lungs:  Clear without wheezing, rales or rhonchi Cardiac: RR, without RMG Abdominal:  Soft, nontender, without masses, guarding, rebound, organomegaly or hernia Breasts:  Examined lying and sitting without masses, retractions, discharge or axillary adenopathy.  Prior sebaceous cyst left tail of Spence resolved Pelvic:  Ext/BUS/vagina with generalized atrophic changes  Adnexa  Without masses or tenderness    Anus and perineum  Normal   Rectovaginal  Normal sphincter tone without palpated masses or tenderness.    Assessment/Plan:  68 y.o. [id number] female for follow up exam.   1. Postmenopausal/atrophic genital changes. Status post TVH with subsequent separate BSO in the past. Doing well without significant hot flushes night sweats vaginal dryness. Continue to monitor. 2. Osteopenia.  DEXA 04/2013 T score -2.1. FRAX 5%/0.8%. Stable from prior DEXA 2010. Was found to be vitamin D deficient at Dr. Shon Baton office. Is on extra vitamin D.  She will follow up with him for recheck of this. 3. Mammography 10/2013. Continue with annual mammography. Prior small sebaceous cyst on left tail of Spence resolved. SBE monthly reviewed. 4. Colonoscopy 2014. Repeat at that they're  recommended interval. 5. Pap smear 2012. No Pap smear done today. Status post hysterectomy for benign indications. Over the age of 72. Reviewed current screening guidelines. Patient comfortable with stop screening. 6. Health maintenance. No routine blood work done as she reports this done at her primary physician's office. Follow up 1 year, sooner as needed.     [name] MD, 9:11 AM 01/14/2014

## 2014-01-15 LAB — URINALYSIS W MICROSCOPIC + REFLEX CULTURE
BACTERIA UA: NONE SEEN
BILIRUBIN URINE: NEGATIVE
Casts: NONE SEEN
Crystals: NONE SEEN
Glucose, UA: NEGATIVE mg/dL
Hgb urine dipstick: NEGATIVE
KETONES UR: NEGATIVE mg/dL
Leukocytes, UA: NEGATIVE
NITRITE: NEGATIVE
Protein, ur: NEGATIVE mg/dL
Specific Gravity, Urine: 1.012 (ref 1.005–1.030)
Squamous Epithelial / LPF: NONE SEEN
UROBILINOGEN UA: 0.2 mg/dL (ref 0.0–1.0)
pH: 6.5 (ref 5.0–8.0)

## 2014-03-26 DIAGNOSIS — H35033 Hypertensive retinopathy, bilateral: Secondary | ICD-10-CM | POA: Diagnosis not present

## 2014-06-15 DIAGNOSIS — J029 Acute pharyngitis, unspecified: Secondary | ICD-10-CM | POA: Diagnosis not present

## 2014-06-15 DIAGNOSIS — Z6826 Body mass index (BMI) 26.0-26.9, adult: Secondary | ICD-10-CM | POA: Diagnosis not present

## 2014-06-15 DIAGNOSIS — J069 Acute upper respiratory infection, unspecified: Secondary | ICD-10-CM | POA: Diagnosis not present

## 2014-06-15 DIAGNOSIS — I10 Essential (primary) hypertension: Secondary | ICD-10-CM | POA: Diagnosis not present

## 2014-11-17 DIAGNOSIS — Z1231 Encounter for screening mammogram for malignant neoplasm of breast: Secondary | ICD-10-CM | POA: Diagnosis not present

## 2014-11-17 DIAGNOSIS — R928 Other abnormal and inconclusive findings on diagnostic imaging of breast: Secondary | ICD-10-CM | POA: Diagnosis not present

## 2014-11-17 DIAGNOSIS — Z803 Family history of malignant neoplasm of breast: Secondary | ICD-10-CM | POA: Diagnosis not present

## 2014-11-19 ENCOUNTER — Encounter: Payer: Self-pay | Admitting: Gynecology

## 2014-11-23 DIAGNOSIS — Z803 Family history of malignant neoplasm of breast: Secondary | ICD-10-CM | POA: Diagnosis not present

## 2014-11-23 DIAGNOSIS — R928 Other abnormal and inconclusive findings on diagnostic imaging of breast: Secondary | ICD-10-CM | POA: Diagnosis not present

## 2014-11-23 DIAGNOSIS — Z1231 Encounter for screening mammogram for malignant neoplasm of breast: Secondary | ICD-10-CM | POA: Diagnosis not present

## 2014-11-24 ENCOUNTER — Encounter: Payer: Self-pay | Admitting: Gynecology

## 2014-11-30 DIAGNOSIS — E538 Deficiency of other specified B group vitamins: Secondary | ICD-10-CM | POA: Diagnosis not present

## 2014-11-30 DIAGNOSIS — R8299 Other abnormal findings in urine: Secondary | ICD-10-CM | POA: Diagnosis not present

## 2014-11-30 DIAGNOSIS — E559 Vitamin D deficiency, unspecified: Secondary | ICD-10-CM | POA: Diagnosis not present

## 2014-11-30 DIAGNOSIS — I10 Essential (primary) hypertension: Secondary | ICD-10-CM | POA: Diagnosis not present

## 2014-12-07 DIAGNOSIS — E559 Vitamin D deficiency, unspecified: Secondary | ICD-10-CM | POA: Diagnosis not present

## 2014-12-07 DIAGNOSIS — K5900 Constipation, unspecified: Secondary | ICD-10-CM | POA: Diagnosis not present

## 2014-12-07 DIAGNOSIS — Z0000 Encounter for general adult medical examination without abnormal findings: Secondary | ICD-10-CM | POA: Diagnosis not present

## 2014-12-07 DIAGNOSIS — M722 Plantar fascial fibromatosis: Secondary | ICD-10-CM | POA: Diagnosis not present

## 2014-12-07 DIAGNOSIS — I10 Essential (primary) hypertension: Secondary | ICD-10-CM | POA: Diagnosis not present

## 2014-12-07 DIAGNOSIS — M25511 Pain in right shoulder: Secondary | ICD-10-CM | POA: Diagnosis not present

## 2014-12-07 DIAGNOSIS — E538 Deficiency of other specified B group vitamins: Secondary | ICD-10-CM | POA: Diagnosis not present

## 2014-12-07 DIAGNOSIS — D229 Melanocytic nevi, unspecified: Secondary | ICD-10-CM | POA: Diagnosis not present

## 2014-12-07 DIAGNOSIS — Z6826 Body mass index (BMI) 26.0-26.9, adult: Secondary | ICD-10-CM | POA: Diagnosis not present

## 2014-12-07 DIAGNOSIS — Z1389 Encounter for screening for other disorder: Secondary | ICD-10-CM | POA: Diagnosis not present

## 2014-12-08 DIAGNOSIS — Z1212 Encounter for screening for malignant neoplasm of rectum: Secondary | ICD-10-CM | POA: Diagnosis not present

## 2015-01-14 DIAGNOSIS — L659 Nonscarring hair loss, unspecified: Secondary | ICD-10-CM | POA: Diagnosis not present

## 2015-01-19 ENCOUNTER — Encounter: Payer: Medicare Other | Admitting: Gynecology

## 2015-01-20 ENCOUNTER — Ambulatory Visit (INDEPENDENT_AMBULATORY_CARE_PROVIDER_SITE_OTHER): Payer: Medicare Other | Admitting: Gynecology

## 2015-01-20 ENCOUNTER — Encounter: Payer: Self-pay | Admitting: Gynecology

## 2015-01-20 VITALS — BP 130/82 | Ht 66.0 in | Wt 160.0 lb

## 2015-01-20 DIAGNOSIS — M8580 Other specified disorders of bone density and structure, unspecified site: Secondary | ICD-10-CM

## 2015-01-20 DIAGNOSIS — Z01419 Encounter for gynecological examination (general) (routine) without abnormal findings: Secondary | ICD-10-CM | POA: Diagnosis not present

## 2015-01-20 DIAGNOSIS — N9089 Other specified noninflammatory disorders of vulva and perineum: Secondary | ICD-10-CM | POA: Diagnosis not present

## 2015-01-20 DIAGNOSIS — N952 Postmenopausal atrophic vaginitis: Secondary | ICD-10-CM | POA: Diagnosis not present

## 2015-01-20 NOTE — Progress Notes (Signed)
Nancy Beck [birth year] [id number]        69 y.o.  G2P2002 for breast and pelvic exam. Several issues noted below.  Past medical history,surgical history, problem list, medications, allergies, family history and social history were all reviewed and documented as reviewed in the EPIC chart.  ROS:  Performed with pertinent positives and negatives included in the history, assessment and plan.   Additional significant findings :  none   Exam: Programmer, multimedia Vitals:   01/20/15 0910  BP: 130/82  Height: 5\' 6"  (1.676 m)  Weight: 160 lb (72.576 kg)   General appearance:  Normal affect, orientation and appearance. Skin: Grossly normal HEENT: Without gross lesions.  No cervical or supraclavicular adenopathy. Thyroid normal.  Lungs:  Clear without wheezing, rales or rhonchi Cardiac: RR, without RMG Abdominal:  Soft, nontender, without masses, guarding, rebound, organomegaly or hernia Breasts:  Examined lying and sitting without masses, retractions, discharge or axillary adenopathy. Pelvic:  Ext/BUS/vagina with patchy symmetrical bilateral vitiligo changes from periclitoral hood to perineal body. Suspicious for lichen sclerosis  Adnexa  Without masses or tenderness    Anus and perineum  Normal   Rectovaginal  Normal sphincter tone without palpated masses or tenderness.    Assessment/Plan:  69 y.o. G20P2002 female for breast and pelvic exam.   1. Vitiligo like vulvar changes never noticed before other by myself or Dr. Cherylann Banas in prior notes. Patient does note some vulvar itching. Recommended baseline biopsy to establish diagnoses rule out lichen sclerosis. Patient agrees will schedule this. 2. Osteopenia. DEXA 2015 T score -2.1 FRAX 5%/0.8%. Stable from prior DEXA.  History of prior Fosamax use for approximate 5-1/2 years with drug holiday in 2009. Subsequently started back on Actonel 2010 but stopped it after one year. Has been on drug-free holiday since then. Plan repeat DEXA  next year at two-year interval. 3. Postmenopausal/atrophic genital changes. No significant hot flushes, night sweats or vaginal dryness. Status post [place] for leiomyoma. Laparoscopic BSO 2013 for bilateral serous cystadenomas. 4. Pap smear 2012. No Pap smear done today. No history of abnormal Pap smears. Status post hysterectomy for benign indications. Over the age of 78. We both agree to stop screening per current screening guidelines. 5. Mammography 2016. Continue with annual mammography when due. SBE monthly reviewed. 6. Colonoscopy 2014. Repeat at their recommended interval. 7. Health maintenance. No routine blood work done as this is done at her primary physician's office. Follow up 1 year, sooner as needed.   [name] MD, 9:29 AM 01/20/2015

## 2015-01-20 NOTE — Patient Instructions (Signed)
Follow up for vulvar biopsy as scheduled 

## 2015-01-24 DIAGNOSIS — L900 Lichen sclerosus et atrophicus: Secondary | ICD-10-CM

## 2015-01-24 HISTORY — DX: Lichen sclerosus et atrophicus: L90.0

## 2015-02-04 ENCOUNTER — Encounter: Payer: Self-pay | Admitting: Gynecology

## 2015-02-04 ENCOUNTER — Ambulatory Visit (INDEPENDENT_AMBULATORY_CARE_PROVIDER_SITE_OTHER): Payer: Medicare Other | Admitting: Gynecology

## 2015-02-04 VITALS — BP 120/78

## 2015-02-04 DIAGNOSIS — N9089 Other specified noninflammatory disorders of vulva and perineum: Secondary | ICD-10-CM | POA: Diagnosis not present

## 2015-02-04 NOTE — Progress Notes (Signed)
Nancy Beck January 24, 1945 [id number]        69 y.o.  [id number] Presents for vulvar biopsy. Patient was recently seen for her annual exam and has a symmetrical vitiligo type pattern from the periclitoral hood down both labia majora to the perianal region. Never really noticed previously. Was asked to return for a baseline biopsy rule out lichen sclerosis.  Past medical history,surgical history, problem list, medications, allergies, family history and social history were all reviewed and documented in the EPIC chart.  Directed ROS with pertinent positives and negatives documented in the history of present illness/assessment and plan.  Exam: Nancy Beck assistant Filed Vitals:   02/04/15 1152  BP: 120/78   General appearance:  Normal External BUS vagina with atrophic changes. Symmetrical vitiligo type pattern from the periclitoral hood down both labia majora to the perianal region. Representative area was biopsied as outlined below. Physical Exam  Genitourinary:     Procedure: The skin overlying the biopsy site was cleansed with Betadine, infiltrated with 1% lidocaine and a representative biopsy was taken in the vitiligo appearing area. Silver nitrate hemostasis applied. Patient tolerated well.   Assessment/Plan:  70 y.o. [id number] with history as above. Patient will follow up for her skin biopsy results. Does not appear to be having significant irritation or itching. Assuming negative biopsy/lichen sclerosis them will plan expectant management. She'll call if irritation occurs for a higher dose steroid cream but otherwise will watch for now.    Nancy Auerbach MD, 12:16 PM 02/04/2015

## 2015-02-04 NOTE — Patient Instructions (Signed)
Office will call you with biopsy results 

## 2015-02-05 ENCOUNTER — Encounter: Payer: Self-pay | Admitting: Gynecology

## 2015-04-19 DIAGNOSIS — L678 Other hair color and hair shaft abnormalities: Secondary | ICD-10-CM | POA: Diagnosis not present

## 2015-06-07 DIAGNOSIS — L678 Other hair color and hair shaft abnormalities: Secondary | ICD-10-CM | POA: Diagnosis not present

## 2015-06-07 DIAGNOSIS — L308 Other specified dermatitis: Secondary | ICD-10-CM | POA: Diagnosis not present

## 2015-08-02 DIAGNOSIS — H35033 Hypertensive retinopathy, bilateral: Secondary | ICD-10-CM | POA: Diagnosis not present

## 2015-11-24 ENCOUNTER — Encounter: Payer: Self-pay | Admitting: Gynecology

## 2015-11-24 DIAGNOSIS — Z1231 Encounter for screening mammogram for malignant neoplasm of breast: Secondary | ICD-10-CM | POA: Diagnosis not present

## 2015-11-24 DIAGNOSIS — Z803 Family history of malignant neoplasm of breast: Secondary | ICD-10-CM | POA: Diagnosis not present

## 2015-12-06 DIAGNOSIS — I10 Essential (primary) hypertension: Secondary | ICD-10-CM | POA: Diagnosis not present

## 2015-12-06 DIAGNOSIS — R8299 Other abnormal findings in urine: Secondary | ICD-10-CM | POA: Diagnosis not present

## 2015-12-06 DIAGNOSIS — E538 Deficiency of other specified B group vitamins: Secondary | ICD-10-CM | POA: Diagnosis not present

## 2015-12-06 DIAGNOSIS — E559 Vitamin D deficiency, unspecified: Secondary | ICD-10-CM | POA: Diagnosis not present

## 2015-12-13 DIAGNOSIS — K5909 Other constipation: Secondary | ICD-10-CM | POA: Diagnosis not present

## 2015-12-13 DIAGNOSIS — M859 Disorder of bone density and structure, unspecified: Secondary | ICD-10-CM | POA: Diagnosis not present

## 2015-12-13 DIAGNOSIS — E538 Deficiency of other specified B group vitamins: Secondary | ICD-10-CM | POA: Diagnosis not present

## 2015-12-13 DIAGNOSIS — Z0000 Encounter for general adult medical examination without abnormal findings: Secondary | ICD-10-CM | POA: Diagnosis not present

## 2015-12-13 DIAGNOSIS — Z6826 Body mass index (BMI) 26.0-26.9, adult: Secondary | ICD-10-CM | POA: Diagnosis not present

## 2015-12-13 DIAGNOSIS — Z1389 Encounter for screening for other disorder: Secondary | ICD-10-CM | POA: Diagnosis not present

## 2015-12-13 DIAGNOSIS — I10 Essential (primary) hypertension: Secondary | ICD-10-CM | POA: Diagnosis not present

## 2015-12-13 DIAGNOSIS — E559 Vitamin D deficiency, unspecified: Secondary | ICD-10-CM | POA: Diagnosis not present

## 2015-12-14 DIAGNOSIS — Z1212 Encounter for screening for malignant neoplasm of rectum: Secondary | ICD-10-CM | POA: Diagnosis not present

## 2016-01-21 ENCOUNTER — Ambulatory Visit (INDEPENDENT_AMBULATORY_CARE_PROVIDER_SITE_OTHER): Payer: Medicare Other | Admitting: Gynecology

## 2016-01-21 ENCOUNTER — Encounter: Payer: Self-pay | Admitting: Gynecology

## 2016-01-21 VITALS — BP 128/78 | Ht 66.0 in | Wt 161.0 lb

## 2016-01-21 DIAGNOSIS — M8580 Other specified disorders of bone density and structure, unspecified site: Secondary | ICD-10-CM

## 2016-01-21 DIAGNOSIS — N952 Postmenopausal atrophic vaginitis: Secondary | ICD-10-CM

## 2016-01-21 DIAGNOSIS — L900 Lichen sclerosus et atrophicus: Secondary | ICD-10-CM | POA: Diagnosis not present

## 2016-01-21 DIAGNOSIS — Z01411 Encounter for gynecological examination (general) (routine) with abnormal findings: Secondary | ICD-10-CM

## 2016-01-21 NOTE — Progress Notes (Signed)
    LOUIE WIRT [birth year] [id number]        70 y.o.  G2P2002 for breast and pelvic exam  Past medical history,surgical history, problem list, medications, allergies, family history and social history were all reviewed and documented as reviewed in the EPIC chart.  ROS:  Performed with pertinent positives and negatives included in the history, assessment and plan.   Additional significant findings :  None   Exam: Wandra Scot assistant Vitals:   01/21/16 0926  BP: 128/78  Weight: 161 lb (73 kg)  Height: 5\' 6"  (1.676 m)   Body mass index is 25.99 kg/m.  General appearance:  Normal affect, orientation and appearance. Skin: Grossly normal HEENT: Without gross lesions.  No cervical or supraclavicular adenopathy. Thyroid normal.  Lungs:  Clear without wheezing, rales or rhonchi Cardiac: RR, without RMG Abdominal:  Soft, nontender, without masses, guarding, rebound, organomegaly or hernia Breasts:  Examined lying and sitting without masses, retractions, discharge or axillary adenopathy. Pelvic:  Ext, BUS, Vagina With atrophic changes. Vitiligo-like areas from periclitoral hood to region  Adnexa without masses or tenderness    Anus and perineum normal   Rectovaginal normal sphincter tone without palpated masses or tenderness.    Assessment/Plan:  70 y.o. G31P2002 female for breast and pelvic exam.   1. Vitiligo vulvar type changes stable from prior exam. Biopsy last year consistent with lichen sclerosis. Patient is asymptomatic and has not used Temovate cream as provided last year. Will continue monitoring as long as no issues will follow expectantly. 2. Osteopenia. DEXA 2015 T score -2.1 FRAX 5%/0.8%. Approximately 5-1/2 years of Fosamax through 2009 then Actonel 20101 year. Check DEXA now at 2 year interval. Patient will schedule follow up for this. 3. Postmenopausal/atrophic genital changes. Status post [facility] 1994 leiomyoma. Subsequent laparoscopic BSO 2013 for bilateral serous  cystadenomas. Doing well without significant hot flushes, night sweats or vaginal dryness. 4. Pap smear 2012. No Pap smear done today. No history of abnormal Pap smears. Per current screening guidelines based on age and hysterectomy history we both agree to stop screening. 5. Colonoscopy 2014. Repeat at their recommended interval. 6. Mammography 11/2015. Continue with annual mammography when due. SBE monthly reviewed. 7. Health maintenance. No routine lab work done as patient reports is done elsewhere. Follow up for DEXA otherwise annual exam in one year, sooner as needed.   [name] MD, 9:52 AM 01/21/2016

## 2016-01-21 NOTE — Patient Instructions (Signed)
Followup for bone density as scheduled. 

## 2016-03-14 ENCOUNTER — Ambulatory Visit (INDEPENDENT_AMBULATORY_CARE_PROVIDER_SITE_OTHER): Payer: Medicare Other

## 2016-03-14 ENCOUNTER — Encounter: Payer: Self-pay | Admitting: Gynecology

## 2016-03-14 ENCOUNTER — Other Ambulatory Visit: Payer: Self-pay | Admitting: Gynecology

## 2016-03-14 DIAGNOSIS — M8589 Other specified disorders of bone density and structure, multiple sites: Secondary | ICD-10-CM

## 2016-03-14 DIAGNOSIS — M8580 Other specified disorders of bone density and structure, unspecified site: Secondary | ICD-10-CM

## 2016-03-14 DIAGNOSIS — L639 Alopecia areata, unspecified: Secondary | ICD-10-CM | POA: Diagnosis not present

## 2016-03-14 DIAGNOSIS — Z780 Asymptomatic menopausal state: Secondary | ICD-10-CM

## 2016-03-14 DIAGNOSIS — B359 Dermatophytosis, unspecified: Secondary | ICD-10-CM | POA: Diagnosis not present

## 2016-03-14 DIAGNOSIS — Z6824 Body mass index (BMI) 24.0-24.9, adult: Secondary | ICD-10-CM | POA: Diagnosis not present

## 2016-03-14 DIAGNOSIS — L209 Atopic dermatitis, unspecified: Secondary | ICD-10-CM | POA: Diagnosis not present

## 2016-03-21 DIAGNOSIS — L821 Other seborrheic keratosis: Secondary | ICD-10-CM | POA: Diagnosis not present

## 2016-03-21 DIAGNOSIS — B078 Other viral warts: Secondary | ICD-10-CM | POA: Diagnosis not present

## 2016-06-08 DIAGNOSIS — Z6826 Body mass index (BMI) 26.0-26.9, adult: Secondary | ICD-10-CM | POA: Diagnosis not present

## 2016-06-08 DIAGNOSIS — I10 Essential (primary) hypertension: Secondary | ICD-10-CM | POA: Diagnosis not present

## 2016-10-17 DIAGNOSIS — H35033 Hypertensive retinopathy, bilateral: Secondary | ICD-10-CM | POA: Diagnosis not present

## 2016-11-25 DIAGNOSIS — Z1231 Encounter for screening mammogram for malignant neoplasm of breast: Secondary | ICD-10-CM | POA: Diagnosis not present

## 2016-11-25 DIAGNOSIS — Z803 Family history of malignant neoplasm of breast: Secondary | ICD-10-CM | POA: Diagnosis not present

## 2016-12-12 DIAGNOSIS — R82998 Other abnormal findings in urine: Secondary | ICD-10-CM | POA: Diagnosis not present

## 2016-12-12 DIAGNOSIS — E538 Deficiency of other specified B group vitamins: Secondary | ICD-10-CM | POA: Diagnosis not present

## 2016-12-12 DIAGNOSIS — I10 Essential (primary) hypertension: Secondary | ICD-10-CM | POA: Diagnosis not present

## 2016-12-12 DIAGNOSIS — E559 Vitamin D deficiency, unspecified: Secondary | ICD-10-CM | POA: Diagnosis not present

## 2016-12-21 DIAGNOSIS — I10 Essential (primary) hypertension: Secondary | ICD-10-CM | POA: Diagnosis not present

## 2016-12-21 DIAGNOSIS — E559 Vitamin D deficiency, unspecified: Secondary | ICD-10-CM | POA: Diagnosis not present

## 2016-12-21 DIAGNOSIS — Z0000 Encounter for general adult medical examination without abnormal findings: Secondary | ICD-10-CM | POA: Diagnosis not present

## 2016-12-21 DIAGNOSIS — M859 Disorder of bone density and structure, unspecified: Secondary | ICD-10-CM | POA: Diagnosis not present

## 2016-12-21 DIAGNOSIS — E538 Deficiency of other specified B group vitamins: Secondary | ICD-10-CM | POA: Diagnosis not present

## 2016-12-21 DIAGNOSIS — Z6826 Body mass index (BMI) 26.0-26.9, adult: Secondary | ICD-10-CM | POA: Diagnosis not present

## 2016-12-21 DIAGNOSIS — Z1389 Encounter for screening for other disorder: Secondary | ICD-10-CM | POA: Diagnosis not present

## 2017-01-22 ENCOUNTER — Encounter: Payer: Self-pay | Admitting: Gynecology

## 2017-01-22 ENCOUNTER — Ambulatory Visit (INDEPENDENT_AMBULATORY_CARE_PROVIDER_SITE_OTHER): Payer: Medicare Other | Admitting: Gynecology

## 2017-01-22 VITALS — BP 124/76 | Ht 67.0 in | Wt 168.0 lb

## 2017-01-22 DIAGNOSIS — N952 Postmenopausal atrophic vaginitis: Secondary | ICD-10-CM | POA: Diagnosis not present

## 2017-01-22 DIAGNOSIS — K5900 Constipation, unspecified: Secondary | ICD-10-CM

## 2017-01-22 DIAGNOSIS — Z01411 Encounter for gynecological examination (general) (routine) with abnormal findings: Secondary | ICD-10-CM | POA: Diagnosis not present

## 2017-01-22 DIAGNOSIS — L900 Lichen sclerosus et atrophicus: Secondary | ICD-10-CM | POA: Diagnosis not present

## 2017-01-22 DIAGNOSIS — M8580 Other specified disorders of bone density and structure, unspecified site: Secondary | ICD-10-CM

## 2017-01-22 NOTE — Patient Instructions (Signed)
Try FiberCon or Metamucil to help with the constipation.  If the symptoms persist then I would follow-up with your gastroenterologist.

## 2017-01-22 NOTE — Progress Notes (Signed)
    Nancy Beck [birth year] [id number]        71 y.o.  [id number] for breast and pelvic exam  Past medical history,surgical history, problem list, medications, allergies, family history and social history were all reviewed and documented as reviewed in the EPIC chart.  ROS:  Performed with pertinent positives and negatives included in the history, assessment and plan.   Additional significant findings : None   Exam: [name] assistant Vitals:   01/22/17 1037  BP: 124/76  Weight: 168 lb (76.2 kg)  Height: 5\' 7"  (1.702 m)   Body mass index is 26.31 kg/m.  General appearance:  Normal affect, orientation and appearance. Skin: Grossly normal HEENT: Without gross lesions.  No cervical or supraclavicular adenopathy. Thyroid normal.  Lungs:  Clear without wheezing, rales or rhonchi Cardiac: RR, without RMG Abdominal:  Soft, nontender, without masses, guarding, rebound, organomegaly or hernia Breasts:  Examined lying and sitting without masses, retractions, discharge or axillary adenopathy. Pelvic:  Ext, BUS, Vagina: With atrophic changes.  Vitiligo-like areas in the periclitoral hood to bilateral labial region.  Adnexa: Without masses or tenderness    Anus and perineum: Normal   Rectovaginal: Normal sphincter tone without palpated masses or tenderness.    Assessment/Plan:  N[age] y.o. [id number] female for breast and pelvic exam, status post [facility] 1994 for leiomyoma and laparoscopic BSO 2013 for bilateral serous cystadenomas.   1. Postmenopausal/atrophic genital changes.  No significant hot flushes, night sweats or vaginal dryness. 2. Constipation.  Patient notes over the last 1-2 months constipation.  She will note difficulty with bowel movements and bloating symptoms intermittently.  Passage of small stool alternating with regular size stool.  No changes in color.  No diarrhea or mucus or blood.  No nausea or vomiting.  No urinary symptoms such as frequency dysuria urgency low back pain  fever or chills.  Had been given a prescription by her primary physician to help with this but she is afraid to use prescription medications.  I reviewed options with her and suggested a trial of Metamucil or FiberCon with plenty of fluids.  Also to stay active with walking and movements.  If symptoms would persist then follow-up with her gastroenterologist.  Patient agrees with this. 3. Osteopenia.  DEXA 02/2016 T score -2.3 FRAX 5% / 1%.  Stable from prior DEXA.  Increase calcium vitamin D and increased weightbearing exercise discussed.  Plan repeat DEXA 2-year interval. 4. Lichen sclerosis, biopsy-proven 2017.  Asymptomatic without treatment.  Exam stable without concerning changes. 5. Pap smear 2012.  No Pap smear done today.  No history of abnormal Pap smears.  Per prior discussion we both agree to stop screening per current recommendations based on hysterectomy history and age. 6. Mammography 11/2016.  Continue with annual mammography when due.  SBE monthly reviewed. 7. Health maintenance.  No routine lab work done as patient does this elsewhere.  Follow-up in 1 year, sooner as needed.  Additional time in excess of her breast and pelvic exam was spent in direct face to face counseling and coordination of care in regards to her constipation.    [name] MD, 11:06 AM 01/22/2017

## 2017-03-26 DIAGNOSIS — Z6826 Body mass index (BMI) 26.0-26.9, adult: Secondary | ICD-10-CM | POA: Diagnosis not present

## 2017-03-26 DIAGNOSIS — Z1389 Encounter for screening for other disorder: Secondary | ICD-10-CM | POA: Diagnosis not present

## 2017-03-26 DIAGNOSIS — I10 Essential (primary) hypertension: Secondary | ICD-10-CM | POA: Diagnosis not present

## 2017-03-26 DIAGNOSIS — G4709 Other insomnia: Secondary | ICD-10-CM | POA: Diagnosis not present

## 2017-05-19 ENCOUNTER — Other Ambulatory Visit: Payer: Self-pay

## 2017-05-19 ENCOUNTER — Emergency Department (HOSPITAL_COMMUNITY): Payer: Medicare Other

## 2017-05-19 ENCOUNTER — Observation Stay (HOSPITAL_COMMUNITY)
Admission: EM | Admit: 2017-05-19 | Discharge: 2017-05-21 | Disposition: A | Discharge status: Home / Self Care | Payer: Medicare Other | Attending: Internal Medicine | Admitting: Internal Medicine

## 2017-05-19 ENCOUNTER — Encounter (HOSPITAL_COMMUNITY): Payer: Self-pay | Admitting: Emergency Medicine

## 2017-05-19 DIAGNOSIS — S8002XA Contusion of left knee, initial encounter: Secondary | ICD-10-CM | POA: Insufficient documentation

## 2017-05-19 DIAGNOSIS — S1091XA Abrasion of unspecified part of neck, initial encounter: Secondary | ICD-10-CM | POA: Diagnosis not present

## 2017-05-19 DIAGNOSIS — Y9389 Activity, other specified: Secondary | ICD-10-CM | POA: Insufficient documentation

## 2017-05-19 DIAGNOSIS — R55 Syncope and collapse: Secondary | ICD-10-CM | POA: Diagnosis not present

## 2017-05-19 DIAGNOSIS — M8580 Other specified disorders of bone density and structure, unspecified site: Secondary | ICD-10-CM | POA: Diagnosis not present

## 2017-05-19 DIAGNOSIS — Y999 Unspecified external cause status: Secondary | ICD-10-CM | POA: Diagnosis not present

## 2017-05-19 DIAGNOSIS — Y92410 Unspecified street and highway as the place of occurrence of the external cause: Secondary | ICD-10-CM | POA: Diagnosis not present

## 2017-05-19 DIAGNOSIS — S3991XA Unspecified injury of abdomen, initial encounter: Secondary | ICD-10-CM | POA: Diagnosis not present

## 2017-05-19 DIAGNOSIS — I10 Essential (primary) hypertension: Secondary | ICD-10-CM | POA: Diagnosis not present

## 2017-05-19 DIAGNOSIS — R1011 Right upper quadrant pain: Secondary | ICD-10-CM | POA: Diagnosis not present

## 2017-05-19 DIAGNOSIS — M25562 Pain in left knee: Secondary | ICD-10-CM | POA: Diagnosis not present

## 2017-05-19 DIAGNOSIS — R51 Headache: Secondary | ICD-10-CM | POA: Diagnosis not present

## 2017-05-19 DIAGNOSIS — M542 Cervicalgia: Secondary | ICD-10-CM | POA: Diagnosis not present

## 2017-05-19 DIAGNOSIS — S2241XA Multiple fractures of ribs, right side, initial encounter for closed fracture: Secondary | ICD-10-CM | POA: Diagnosis not present

## 2017-05-19 DIAGNOSIS — S8992XA Unspecified injury of left lower leg, initial encounter: Secondary | ICD-10-CM | POA: Diagnosis not present

## 2017-05-19 DIAGNOSIS — Z79899 Other long term (current) drug therapy: Secondary | ICD-10-CM | POA: Diagnosis not present

## 2017-05-19 DIAGNOSIS — S2231XA Fracture of one rib, right side, initial encounter for closed fracture: Secondary | ICD-10-CM | POA: Diagnosis not present

## 2017-05-19 DIAGNOSIS — S199XXA Unspecified injury of neck, initial encounter: Secondary | ICD-10-CM | POA: Diagnosis not present

## 2017-05-19 DIAGNOSIS — S0990XA Unspecified injury of head, initial encounter: Secondary | ICD-10-CM | POA: Diagnosis not present

## 2017-05-19 LAB — COMPREHENSIVE METABOLIC PANEL
ALBUMIN: 4 g/dL (ref 3.5–5.0)
ALK PHOS: 58 U/L (ref 38–126)
ALT: 28 U/L (ref 14–54)
AST: 34 U/L (ref 15–41)
Anion gap: 10 (ref 5–15)
BILIRUBIN TOTAL: 0.8 mg/dL (ref 0.3–1.2)
BUN: 28 mg/dL — AB (ref 6–20)
CALCIUM: 9.2 mg/dL (ref 8.9–10.3)
CO2: 22 mmol/L (ref 22–32)
CREATININE: 1.1 mg/dL — AB (ref 0.44–1.00)
Chloride: 105 mmol/L (ref 101–111)
GFR calc Af Amer: 58 mL/min — ABNORMAL LOW (ref >60)
GFR, EST NON AFRICAN AMERICAN: 50 mL/min — AB (ref >60)
GLUCOSE: 129 mg/dL — AB (ref 65–99)
Potassium: 3.8 mmol/L (ref 3.5–5.1)
Sodium: 137 mmol/L (ref 135–145)
TOTAL PROTEIN: 6.6 g/dL (ref 6.5–8.1)

## 2017-05-19 LAB — URINALYSIS, ROUTINE W REFLEX MICROSCOPIC
Bacteria, UA: NONE SEEN
Bilirubin Urine: NEGATIVE
GLUCOSE, UA: NEGATIVE mg/dL
Hgb urine dipstick: NEGATIVE
Ketones, ur: 5 mg/dL — AB
Nitrite: NEGATIVE
PH: 7 (ref 5.0–8.0)
Protein, ur: NEGATIVE mg/dL
Specific Gravity, Urine: 1.013 (ref 1.005–1.030)

## 2017-05-19 LAB — CBC
HEMATOCRIT: 38.5 % (ref 36.0–46.0)
HEMOGLOBIN: 12.3 g/dL (ref 12.0–15.0)
MCH: 28 pg (ref 26.0–34.0)
MCHC: 31.9 g/dL (ref 30.0–36.0)
MCV: 87.5 fL (ref 78.0–100.0)
Platelets: 201 10*3/uL (ref 150–400)
RBC: 4.4 MIL/uL (ref 3.87–5.11)
RDW: 12.9 % (ref 11.5–15.5)
WBC: 13.8 10*3/uL — AB (ref 4.0–10.5)

## 2017-05-19 LAB — TROPONIN I: Troponin I: <0.03 ng/mL (ref <0.03)

## 2017-05-19 MED ORDER — SODIUM CHLORIDE 0.9 % IV BOLUS
500.0000 mL | Freq: Once | INTRAVENOUS | Status: AC
  Administered 2017-05-19: 500 mL via INTRAVENOUS

## 2017-05-19 MED ORDER — IOPAMIDOL (ISOVUE-300) INJECTION 61%
100.0000 mL | Freq: Once | INTRAVENOUS | Status: AC | PRN
  Administered 2017-05-19: 100 mL via INTRAVENOUS
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
## 2017-05-19 NOTE — ED Notes (Signed)
Gave EKG to Dr.Ray
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
## 2017-05-20 ENCOUNTER — Observation Stay (HOSPITAL_BASED_OUTPATIENT_CLINIC_OR_DEPARTMENT_OTHER): Payer: Medicare Other

## 2017-05-20 ENCOUNTER — Other Ambulatory Visit: Payer: Self-pay

## 2017-05-20 ENCOUNTER — Observation Stay (HOSPITAL_COMMUNITY): Payer: Medicare Other

## 2017-05-20 DIAGNOSIS — S1091XA Abrasion of unspecified part of neck, initial encounter: Secondary | ICD-10-CM | POA: Diagnosis not present

## 2017-05-20 DIAGNOSIS — R55 Syncope and collapse: Secondary | ICD-10-CM | POA: Diagnosis present

## 2017-05-20 DIAGNOSIS — Z79899 Other long term (current) drug therapy: Secondary | ICD-10-CM | POA: Diagnosis not present

## 2017-05-20 DIAGNOSIS — I9589 Other hypotension: Secondary | ICD-10-CM

## 2017-05-20 DIAGNOSIS — I10 Essential (primary) hypertension: Secondary | ICD-10-CM | POA: Diagnosis not present

## 2017-05-20 DIAGNOSIS — M8580 Other specified disorders of bone density and structure, unspecified site: Secondary | ICD-10-CM | POA: Diagnosis not present

## 2017-05-20 DIAGNOSIS — S2231XA Fracture of one rib, right side, initial encounter for closed fracture: Secondary | ICD-10-CM | POA: Diagnosis not present

## 2017-05-20 DIAGNOSIS — R1011 Right upper quadrant pain: Secondary | ICD-10-CM | POA: Diagnosis not present

## 2017-05-20 DIAGNOSIS — S8002XA Contusion of left knee, initial encounter: Secondary | ICD-10-CM | POA: Diagnosis not present

## 2017-05-20 LAB — BASIC METABOLIC PANEL
Anion gap: 10 (ref 5–15)
BUN: 18 mg/dL (ref 6–20)
CALCIUM: 8.5 mg/dL — AB (ref 8.9–10.3)
CO2: 22 mmol/L (ref 22–32)
CREATININE: 0.84 mg/dL (ref 0.44–1.00)
Chloride: 108 mmol/L (ref 101–111)
GFR calc non Af Amer: >60 mL/min (ref >60)
Glucose, Bld: 114 mg/dL — ABNORMAL HIGH (ref 65–99)
Potassium: 3.6 mmol/L (ref 3.5–5.1)
Sodium: 140 mmol/L (ref 135–145)

## 2017-05-20 LAB — CBC
HCT: 34.3 % — ABNORMAL LOW (ref 36.0–46.0)
Hemoglobin: 11.1 g/dL — ABNORMAL LOW (ref 12.0–15.0)
MCH: 28.1 pg (ref 26.0–34.0)
MCHC: 32.4 g/dL (ref 30.0–36.0)
MCV: 86.8 fL (ref 78.0–100.0)
PLATELETS: 192 10*3/uL (ref 150–400)
RBC: 3.95 MIL/uL (ref 3.87–5.11)
RDW: 13 % (ref 11.5–15.5)
WBC: 6.1 10*3/uL (ref 4.0–10.5)

## 2017-05-20 LAB — ETHANOL: Alcohol, Ethyl (B): <10 mg/dL (ref <10)

## 2017-05-20 LAB — GLUCOSE, CAPILLARY: GLUCOSE-CAPILLARY: 112 mg/dL — AB (ref 65–99)

## 2017-05-20 LAB — RAPID URINE DRUG SCREEN, HOSP PERFORMED
Amphetamines: NOT DETECTED
BARBITURATES: NOT DETECTED
BENZODIAZEPINES: NOT DETECTED
COCAINE: NOT DETECTED
Opiates: NOT DETECTED
TETRAHYDROCANNABINOL: NOT DETECTED

## 2017-05-20 LAB — ECHOCARDIOGRAM COMPLETE
Height: 67 in
WEIGHTICAEL: 2793.67 [oz_av]

## 2017-05-20 MED ORDER — OXYCODONE HCL 5 MG PO TABS
5.0000 mg | ORAL_TABLET | ORAL | Status: DC | PRN
Start: 2017-05-20 — End: 2017-05-21
  Administered 2017-05-20: 5 mg via ORAL
  Filled 2017-05-20 (×2): qty 1

## 2017-05-20 MED ORDER — ACETAMINOPHEN 650 MG RE SUPP: 650.0000 mg | Freq: Four times a day (QID) | RECTAL | Status: DC | PRN

## 2017-05-20 MED ORDER — ONDANSETRON HCL 4 MG PO TABS: 4.0000 mg | ORAL_TABLET | Freq: Four times a day (QID) | ORAL | Status: DC | PRN

## 2017-05-20 MED ORDER — KETOROLAC TROMETHAMINE 15 MG/ML IJ SOLN
15.0000 mg | Freq: Four times a day (QID) | INTRAMUSCULAR | Status: DC | PRN
  Administered 2017-05-20: 15 mg via INTRAVENOUS
  Filled 2017-05-20: qty 1

## 2017-05-20 MED ORDER — CARVEDILOL 12.5 MG PO TABS
12.5000 mg | ORAL_TABLET | Freq: Two times a day (BID) | ORAL | Status: DC
  Administered 2017-05-20: 12.5 mg via ORAL
  Filled 2017-05-20: qty 1

## 2017-05-20 MED ORDER — SODIUM CHLORIDE 0.9 % IV BOLUS
1000.0000 mL | Freq: Once | INTRAVENOUS | Status: AC
  Administered 2017-05-20: 1000 mL via INTRAVENOUS

## 2017-05-20 MED ORDER — ACETAMINOPHEN 325 MG PO TABS
650.0000 mg | ORAL_TABLET | Freq: Four times a day (QID) | ORAL | Status: DC | PRN
  Administered 2017-05-20: 650 mg via ORAL
  Filled 2017-05-20: qty 2

## 2017-05-20 MED ORDER — LIDOCAINE 5 % EX PTCH
1.0000 | MEDICATED_PATCH | CUTANEOUS | Status: DC
  Administered 2017-05-20 – 2017-05-21 (×2): 1 via TRANSDERMAL
  Filled 2017-05-20 (×2): qty 1

## 2017-05-20 MED ORDER — AMLODIPINE BESYLATE 5 MG PO TABS: 5.0000 mg | ORAL_TABLET | Freq: Every day | ORAL | Status: DC

## 2017-05-20 MED ORDER — SODIUM CHLORIDE 0.9 % IV SOLN
INTRAVENOUS | Status: DC
  Administered 2017-05-20: 23:00:00 via INTRAVENOUS

## 2017-05-20 MED ORDER — SODIUM CHLORIDE 0.9 % IV SOLN
INTRAVENOUS | Status: AC
  Administered 2017-05-20: 02:00:00 via INTRAVENOUS

## 2017-05-20 MED ORDER — ONDANSETRON HCL 4 MG/2ML IJ SOLN: 4.0000 mg | Freq: Four times a day (QID) | INTRAMUSCULAR | Status: DC | PRN
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
## 2017-05-20 NOTE — Progress Notes (Signed)
*  PRELIMINARY RESULTS* Echocardiogram 2D Echocardiogram has been performed.  Leavy Cella 05/20/2017, 10:56 AM
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
## 2017-05-21 DIAGNOSIS — S2231XD Fracture of one rib, right side, subsequent encounter for fracture with routine healing: Secondary | ICD-10-CM

## 2017-05-21 DIAGNOSIS — I951 Orthostatic hypotension: Secondary | ICD-10-CM | POA: Diagnosis not present

## 2017-05-21 DIAGNOSIS — S1091XA Abrasion of unspecified part of neck, initial encounter: Secondary | ICD-10-CM | POA: Diagnosis not present

## 2017-05-21 DIAGNOSIS — S2231XA Fracture of one rib, right side, initial encounter for closed fracture: Secondary | ICD-10-CM

## 2017-05-21 LAB — GLUCOSE, CAPILLARY: Glucose-Capillary: 96 mg/dL (ref 65–99)

## 2017-05-21 MED ORDER — OXYCODONE HCL 5 MG PO TABS: 5.0000 mg | ORAL_TABLET | ORAL | 0 refills | Status: DC | PRN

## 2017-05-21 MED ORDER — LIDOCAINE 5 % EX PTCH: 1.0000 | MEDICATED_PATCH | CUTANEOUS | 0 refills | Status: DC
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
## 2017-05-21 NOTE — Progress Notes (Signed)
IV removed, WNL. D/C instructions given to pt. Verbalized understanding. Pt family member at bedside to transport home.  

## 2017-05-21 NOTE — Discharge Instructions (Signed)
Cardiac Event Monitoring °A cardiac event monitor is a small recording device that is used to detect abnormal heart rhythms (arrhythmias). The monitor is used to record your heart rhythm when you have symptoms, such as: °· Fast heartbeats (palpitations), such as heart racing or fluttering. °· Dizziness. °· Fainting or light-headedness. °· Unexplained weakness. ° °Some monitors are wired to electrodes placed on your chest. Electrodes are flat, sticky disks that attach to your skin. Other monitors may be hand-held or worn on the wrist. The monitor can be worn for up to 30 days. °If the monitor is attached to your chest, a technician will prepare your chest for the electrode placement and show you how to work the monitor. Take time to practice using the monitor before you leave the office. Make sure you understand how to send the information from the monitor to your health care provider. In some cases, you may need to use a landline telephone instead of a cell phone. °What are the risks? °Generally, this device is safe to use, but it possible that the skin under the electrodes will become irritated. °How to use your cardiac event monitor °· Wear your monitor at all times, except when you are in water: °? Do not let the monitor get wet. °? Take the monitor off when you bathe. Do not swim or use a hot tub with it on. °· Keep your skin clean. Do not put body lotion or moisturizer on your chest. °· Change the electrodes as told by your health care provider or any time they stop sticking to your skin. You may need to use medical tape to keep them on. °· Try to put the electrodes in slightly different places on your chest to help prevent skin irritation. They must remain in the area under your left breast and in the upper right section of your chest. °· Make sure the monitor is safely clipped to your clothing or in a location close to your body that your health care provider recommends. °· Press the button to record as soon  as you feel heart-related symptoms, such as: °? Dizziness. °? Weakness. °? Light-headedness. °? Palpitations. °? Thumping or pounding in your chest. °? Shortness of breath. °? Unexplained weakness. °· Keep a diary of your activities, such as walking, doing chores, and taking medicine. It is very important to note what you were doing when you pushed the button to record your symptoms. This will help your health care provider determine what might be contributing to your symptoms. °· Send the recorded information as recommended by your health care provider. It may take some time for your health care provider to process the results. °· Change the batteries as told by your health care provider. °· Keep electronic devices away from your monitor. This includes: °? Tablets. °? MP3 players. °? Cell phones. °· While wearing your monitor you should avoid: °? Electric blankets. °? Electric razors. °? Electric toothbrushes. °? Microwave ovens. °? Magnets. °? Metal detectors. °Get help right away if: °· You have chest pain. °· You have extreme difficulty breathing or shortness of breath. °· You develop a very fast heartbeat that persists. °· You develop dizziness that does not go away. °· You faint or constantly feel like you are about to faint. °Summary °· A cardiac event monitor is a small recording device that is used to help detect abnormal heart rhythms (arrhythmias). °· The monitor is used to record your heart rhythm when you have heart-related symptoms. °· Make   sure you understand how to send the information from the monitor to your health care provider. °· It is important to press the button on the monitor when you have any heart-related symptoms. °· Keep a diary of your activities, such as walking, doing chores, and taking medicine. It is very important to note what you were doing when you pushed the button to record your symptoms. This will help your health care provider learn what might be causing your symptoms. °This  information is not intended to replace advice given to you by your health care provider. Make sure you discuss any questions you have with your health care provider. °Document Released: 10/19/2007 Document Revised: 12/25/2015 Document Reviewed: 12/25/2015 °Elsevier Interactive Patient Education © 2017 [employer] ° °

## 2017-05-21 NOTE — Care Management Obs Status (Signed)
[facility] NOTIFICATION   Patient Details  Name: LATONYIA LOPATA MRN: [id number] Date of Birth: 1945-04-22   Medicare Observation Status Notification Given:  Yes    Mileidy Atkin, Chauncey Reading, RN 05/21/2017, 9:48 AM

## 2017-05-22 ENCOUNTER — Telehealth: Payer: Self-pay

## 2017-05-22 NOTE — Telephone Encounter (Signed)
30 day event monitored to be mailed to patient's home at request of Dr Bronson Ing and Dr. Clementeen Graham

## 2017-05-28 DIAGNOSIS — I10 Essential (primary) hypertension: Secondary | ICD-10-CM | POA: Diagnosis not present

## 2017-05-28 DIAGNOSIS — R55 Syncope and collapse: Secondary | ICD-10-CM | POA: Diagnosis not present

## 2017-05-28 DIAGNOSIS — Z6826 Body mass index (BMI) 26.0-26.9, adult: Secondary | ICD-10-CM | POA: Diagnosis not present

## 2017-06-05 DIAGNOSIS — M542 Cervicalgia: Secondary | ICD-10-CM | POA: Diagnosis not present

## 2017-06-05 DIAGNOSIS — R55 Syncope and collapse: Secondary | ICD-10-CM | POA: Diagnosis not present

## 2017-06-05 DIAGNOSIS — M545 Low back pain: Secondary | ICD-10-CM | POA: Diagnosis not present

## 2017-07-18 ENCOUNTER — Ambulatory Visit (INDEPENDENT_AMBULATORY_CARE_PROVIDER_SITE_OTHER): Payer: Medicare Other | Admitting: Cardiovascular Disease

## 2017-07-18 ENCOUNTER — Encounter: Payer: Self-pay | Admitting: Cardiovascular Disease

## 2017-07-18 VITALS — BP 156/88 | HR 74 | Ht 67.0 in | Wt 165.8 lb

## 2017-07-18 DIAGNOSIS — R55 Syncope and collapse: Secondary | ICD-10-CM | POA: Diagnosis not present

## 2017-07-18 DIAGNOSIS — I10 Essential (primary) hypertension: Secondary | ICD-10-CM

## 2017-07-18 DIAGNOSIS — Z9289 Personal history of other medical treatment: Secondary | ICD-10-CM | POA: Diagnosis not present
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
## 2017-07-18 NOTE — Patient Instructions (Signed)
Medication Instructions:  Your physician recommends that you continue on your current medications as directed. Please refer to the Current Medication list given to you today.   Labwork: none  Testing/Procedures: Your physician has recommended that you wear an event monitor. Event monitors are medical devices that record the heart's electrical activity. Doctors most often [place] these monitors to diagnose arrhythmias. Arrhythmias are problems with the speed or rhythm of the heartbeat. The monitor is a small, portable device. You can wear one while you do your normal daily activities. This is usually used to diagnose what is causing palpitations/syncope (passing out).    Follow-Up: Your physician recommends that you schedule a follow-up appointment in: 3 months    Any Other Special Instructions Will Be Listed Below (If Applicable).     If you need a refill on your cardiac medications before your next appointment, please call your pharmacy.

## 2017-07-23 ENCOUNTER — Ambulatory Visit (INDEPENDENT_AMBULATORY_CARE_PROVIDER_SITE_OTHER): Payer: Medicare Other

## 2017-07-23 DIAGNOSIS — R55 Syncope and collapse: Secondary | ICD-10-CM

## 2017-08-16 ENCOUNTER — Encounter: Payer: Self-pay | Admitting: Internal Medicine

## 2017-08-16 ENCOUNTER — Telehealth: Payer: Self-pay | Admitting: Internal Medicine

## 2017-08-27 ENCOUNTER — Ambulatory Visit (AMBULATORY_SURGERY_CENTER): Payer: Federal, State, Local not specified - PPO | Admitting: *Deleted

## 2017-08-27 VITALS — Ht 67.0 in | Wt 167.8 lb

## 2017-08-27 DIAGNOSIS — Z86010 Personal history of colonic polyps: Secondary | ICD-10-CM

## 2017-08-27 NOTE — Progress Notes (Signed)
No egg or soy allergy known to patient  No issues with past sedation with any surgeries  or procedures, no intubation problems  No diet pills per patient No home 02 use per patient  No blood thinners per patient  Pt denies issues with constipation  No A fib or A flutter  EMMI video sent to pt's e mail  

## 2017-08-31 ENCOUNTER — Encounter: Payer: Self-pay | Admitting: Gynecology

## 2017-08-31 ENCOUNTER — Telehealth: Payer: Self-pay | Admitting: *Deleted

## 2017-08-31 ENCOUNTER — Ambulatory Visit (INDEPENDENT_AMBULATORY_CARE_PROVIDER_SITE_OTHER): Payer: Medicare Other | Admitting: Gynecology

## 2017-08-31 VITALS — BP 124/82

## 2017-08-31 DIAGNOSIS — N644 Mastodynia: Secondary | ICD-10-CM

## 2017-08-31 NOTE — Telephone Encounter (Signed)
Patient scheduled on 09/06/17 @ 10:45am at [facility] order faxed to [phone]. Patient aware of appointment time and date.
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
## 2017-08-31 NOTE — Patient Instructions (Addendum)
The breast center will call you to arrange for the mammogram.  Call my office if you do not hear from them within 1 to 2 weeks.

## 2017-09-06 ENCOUNTER — Encounter: Payer: Self-pay | Admitting: Gynecology

## 2017-09-06 DIAGNOSIS — N644 Mastodynia: Secondary | ICD-10-CM | POA: Diagnosis not present

## 2017-09-10 ENCOUNTER — Ambulatory Visit (AMBULATORY_SURGERY_CENTER): Payer: Medicare Other | Admitting: Internal Medicine

## 2017-09-10 ENCOUNTER — Encounter: Payer: Self-pay | Admitting: Internal Medicine

## 2017-09-10 VITALS — BP 157/76 | HR 55 | Temp 97.8°F | Resp 11 | Ht 67.0 in | Wt 167.0 lb

## 2017-09-10 DIAGNOSIS — D120 Benign neoplasm of cecum: Secondary | ICD-10-CM

## 2017-09-10 DIAGNOSIS — Z1211 Encounter for screening for malignant neoplasm of colon: Secondary | ICD-10-CM | POA: Diagnosis not present

## 2017-09-10 DIAGNOSIS — Z86010 Personal history of colonic polyps: Secondary | ICD-10-CM

## 2017-09-10 MED ORDER — SODIUM CHLORIDE 0.9 % IV SOLN: 500.0000 mL | Freq: Once | INTRAVENOUS | Status: DC

## 2017-09-10 NOTE — Progress Notes (Signed)
Called to room to assist during endoscopic procedure.  Patient ID and intended procedure confirmed with present staff. Received instructions for my participation in the procedure from the performing physician.  

## 2017-09-10 NOTE — Patient Instructions (Addendum)
I found and removed 2 tiny polyps.  I recommend you take Benefiber 1 tablespoon daily - and may take 2 tablespoons daily if that is not enough.  Please call this week and schedule a follow-up appointment with me regarding constipation and bloating also.  I will let you know pathology results and when to have another routine colonoscopy by mail and/or My Chart.  I appreciate the opportunity to care for you. Gatha Mayer, MD, FACG   YOU HAD AN ENDOSCOPIC PROCEDURE TODAY AT [facility]:   Refer to the procedure report that was given to you for any specific questions about what was found during the examination.  If the procedure report does not answer your questions, please call your gastroenterologist to clarify.  If you requested that your care partner not be given the details of your procedure findings, then the procedure report has been included in a sealed envelope for you to review at your convenience later.  YOU SHOULD EXPECT: Some feelings of bloating in the abdomen. Passage of more gas than usual.  Walking can help get rid of the air that was put into your GI tract during the procedure and reduce the bloating. If you had a lower endoscopy (such as a colonoscopy or flexible sigmoidoscopy) you may notice spotting of blood in your stool or on the toilet paper. If you underwent a bowel prep for your procedure, you may not have a normal bowel movement for a few days.  Please Note:  You might notice some irritation and congestion in your nose or some drainage.  This is from the oxygen used during your procedure.  There is no need for concern and it should clear up in a day or so.  SYMPTOMS TO REPORT IMMEDIATELY:   Following lower endoscopy (colonoscopy or flexible sigmoidoscopy):  Excessive amounts of blood in the stool  Significant tenderness or worsening of abdominal pains  Swelling of the abdomen that is new, acute  Fever of 100F or higher    For urgent or  emergent issues, a gastroenterologist can be reached at any hour by calling [phone].   DIET:  We do recommend a small meal at first, but then you may proceed to your regular diet.  Drink plenty of fluids but you should avoid alcoholic beverages for 24 hours.  ACTIVITY:  You should plan to take it easy for the rest of today and you should NOT DRIVE or use heavy machinery until tomorrow (because of the sedation medicines used during the test).    FOLLOW UP: Our staff will call the number listed on your records the next business day following your procedure to check on you and address any questions or concerns that you may have regarding the information given to you following your procedure. If we do not reach you, we will leave a message.  However, if you are feeling well and you are not experiencing any problems, there is no need to return our call.  We will assume that you have returned to your regular daily activities without incident.  If any biopsies were taken you will be contacted by phone or by letter within the next 1-3 weeks.  Please call us at [phone] if you have not heard about the biopsies in 3 weeks.    SIGNATURES/CONFIDENTIALITY: You and/or your care partner have signed paperwork which will be entered into your electronic medical record.  These signatures attest to the fact that that the information above  on your After Visit Summary has been reviewed and is understood.  Full responsibility of the confidentiality of this discharge information lies with you and/or your care-partner.

## 2017-09-10 NOTE — Progress Notes (Signed)
A/ox3, pleased with MAC, report to RN 

## 2017-09-10 NOTE — Op Note (Signed)
[place] Patient Name: Nancy Beck Procedure Date: 09/10/2017 9:13 AM MRN: [id number] Endoscopist: Gatha Mayer , MD Age: 72 Referring MD:  Date of Birth: 03-22-1945 Gender: Female Account #: [id number] Procedure:                Colonoscopy Indications:              Surveillance: Personal history of adenomatous                            polyps on last colonoscopy 5 years ago Medicines:                Propofol per Anesthesia, Monitored Anesthesia Care Procedure:                Pre-Anesthesia Assessment:                           - Prior to the procedure, a History and Physical                            was performed, and patient medications and                            allergies were reviewed. The patient's tolerance of                            previous anesthesia was also reviewed. The risks                            and benefits of the procedure and the sedation                            options and risks were discussed with the patient.                            All questions were answered, and informed consent                            was obtained. Prior Anticoagulants: The patient has                            taken no previous anticoagulant or antiplatelet                            agents. ASA Grade Assessment: II - A patient with                            mild systemic disease. After reviewing the risks                            and benefits, the patient was deemed in                            satisfactory condition to undergo the procedure.  After obtaining informed consent, the colonoscope                            was passed under direct vision. Throughout the                            procedure, the patient's blood pressure, pulse, and                            oxygen saturations were monitored continuously. The                            Model CF-HQ190L [id number]) scope was introduced    through the anus and advanced to the the cecum,                            identified by appendiceal orifice and ileocecal                            valve. The colonoscopy was performed without                            difficulty. The patient tolerated the procedure                            well. The quality of the bowel preparation was                            excellent. The ileocecal valve, appendiceal                            orifice, and rectum were photographed. The bowel                            preparation used was Miralax. Scope In: 9:24:29 AM Scope Out: 6:28:63 AM Scope Withdrawal Time: 0 hours 9 minutes 11 seconds  Total Procedure Duration: 0 hours 12 minutes 18 seconds  Findings:                 Two sessile polyps were found in the cecum. The                            polyps were 1 to 2 mm in size. These polyps were                            removed with a cold biopsy forceps. Resection and                            retrieval were complete. Verification of patient                            identification for the specimen was done. Estimated  blood loss was minimal.                           Internal hemorrhoids were found.                           The exam was otherwise without abnormality on                            direct and retroflexion views. Complications:            No immediate complications. Estimated Blood Loss:     Estimated blood loss was minimal. Impression:               - Two 1 to 2 mm polyps in the cecum, removed with a                            cold biopsy forceps. Resected and retrieved.                           - Internal hemorrhoids.                           - The examination was otherwise normal on direct                            and retroflexion views.                           - Personal history of colonic polyp - diminutive                            adenoma 2019. Recommendation:           - Patient has  a contact number available for                            emergencies. The signs and symptoms of potential                            delayed complications were discussed with the                            patient. Return to normal activities tomorrow.                            Written discharge instructions were provided to the                            patient.                           - Resume previous diet.                           - Continue present medications.                           -  Repeat colonoscopy is recommended for                            surveillance. The colonoscopy date will be                            determined after pathology results from today's                            exam become available for review.                           - Take Benefiber 1-2 tablespoons daily Gatha Mayer, MD 09/10/2017 9:46:10 AM This report has been signed electronically.

## 2017-09-11 ENCOUNTER — Telehealth: Payer: Self-pay

## 2017-09-11 NOTE — Telephone Encounter (Signed)
  Follow up Call-  Call back number 09/10/2017  Post procedure Call Back phone  # [phone]  Permission to leave phone message Yes  Some recent data might be hidden     Patient questions:  Do you have a fever, pain , or abdominal swelling? No. Pain Score  0 *  Have you tolerated food without any problems? Yes.    Have you been able to return to your normal activities? Yes.    Do you have any questions about your discharge instructions: Diet   No. Medications  No. Follow up visit  No.  Do you have questions or concerns about your Care? No.  Actions: * If pain score is 4 or above: No action needed, pain <4.  Pt reported she had a lot of gas after she went home.  I explained this was normal.  Encouraged to bear down and try to expel the gas.  I asked if she had tolerated her food without problems.  Per pt she did not eat much yesterday.  I told her if she would like she could take OTC Gas-X and for her to call us back if her sx do not resolve today.  Pt said she would.  maw

## 2017-09-16 ENCOUNTER — Encounter: Payer: Self-pay | Admitting: Internal Medicine

## 2017-09-16 NOTE — Progress Notes (Signed)
One diminutive adenoma Recall 2024 My Chart

## 2017-10-23 ENCOUNTER — Ambulatory Visit: Payer: Federal, State, Local not specified - PPO | Admitting: Cardiovascular Disease

## 2017-11-01 ENCOUNTER — Ambulatory Visit (INDEPENDENT_AMBULATORY_CARE_PROVIDER_SITE_OTHER): Payer: Medicare Other | Admitting: Internal Medicine

## 2017-11-01 ENCOUNTER — Encounter: Payer: Self-pay | Admitting: Internal Medicine

## 2017-11-01 VITALS — BP 144/82 | HR 76 | Ht 67.0 in | Wt 165.4 lb

## 2017-11-01 DIAGNOSIS — K581 Irritable bowel syndrome with constipation: Secondary | ICD-10-CM

## 2017-11-01 NOTE — Patient Instructions (Signed)
  We are giving you a handout on high fiber diet to read.   We are giving you a Power Pudding recipe to try.   Follow up with Dr Carlean Purl as needed.    I appreciate the opportunity to care for you. Silvano Rusk, MD, [facility]

## 2017-11-01 NOTE — Progress Notes (Signed)
   [place] 72 y.o. 1946/01/12 [id number]  Assessment & Plan:   Encounter Diagnosis  Name Primary?  . Irritable bowel syndrome with constipation Yes     Power pudding to be used and also try High fiber diet Recipe for power pudding and high-fiber diet instruction given and discussion about.  Follow-up as needed I appreciate the opportunity to care for this patient. CC: Leanna Battles, MD   Subjective:   Chief Complaint: Constipation bloating abdominal pain  HPI The patient is here with complaints of incomplete defecation and hard balls of stool.  At her colonoscopy a few months ago I had suggested using Benefiber which she is trying but does not feel like it is effectively relieving things though it might be a bit better.  When she walks it feels like she has to defecate and then she does not produce a stool.  She has bloating.  Diet history is that she tends to eat more meats than vegetables though she will eat Kehler collard's not large amounts not consistent fruit ingestion or rale vegetables.  Does drink several glasses of water a day.  Colonoscopy in 2019 August demonstrated diminutive adenoma she did not have significant diverticulosis. No Known Allergies Current Meds  Medication Sig  . amLODipine (NORVASC) 5 MG tablet Take 5 mg by mouth daily.  . Biotin 5000 MCG CAPS Take by mouth.  . Calcium Carbonate-Vitamin D (CALTRATE 600+D PO) Take by mouth.  . carvedilol (COREG) 12.5 MG tablet Take 12.5 mg by mouth 2 (two) times daily.  . Multiple Vitamin (MULTIVITAMIN) tablet Take 1 tablet by mouth daily.  . polycarbophil (FIBERCON) 625 MG tablet Take 625 mg by mouth daily.  . Wheat Dextrin (BENEFIBER PO) Take by mouth 3 (three) times daily.   Past medical family and surgical history reviewed in the EMR Review of Systems As above  Objective:   Physical Exam BP (!) 144/82   Pulse 76   Ht 5\' 7"  (1.702 m)   Wt 165 lb 6 oz (75 kg)   BMI 25.90 kg/m    Well-developed looking younger than stated age and in no acute distress  15 minutes time spent with patient > half in counseling coordination of care

## 2017-11-27 DIAGNOSIS — I10 Essential (primary) hypertension: Secondary | ICD-10-CM | POA: Diagnosis not present

## 2017-11-27 DIAGNOSIS — R82998 Other abnormal findings in urine: Secondary | ICD-10-CM | POA: Diagnosis not present

## 2017-11-27 DIAGNOSIS — E538 Deficiency of other specified B group vitamins: Secondary | ICD-10-CM | POA: Diagnosis not present

## 2017-11-27 DIAGNOSIS — E559 Vitamin D deficiency, unspecified: Secondary | ICD-10-CM | POA: Diagnosis not present

## 2017-12-11 ENCOUNTER — Encounter: Payer: Self-pay | Admitting: Gynecology

## 2017-12-11 DIAGNOSIS — T1490XA Injury, unspecified, initial encounter: Secondary | ICD-10-CM | POA: Diagnosis not present

## 2017-12-14 DIAGNOSIS — I10 Essential (primary) hypertension: Secondary | ICD-10-CM | POA: Diagnosis not present

## 2017-12-14 DIAGNOSIS — Z6825 Body mass index (BMI) 25.0-25.9, adult: Secondary | ICD-10-CM | POA: Diagnosis not present

## 2017-12-14 DIAGNOSIS — K5909 Other constipation: Secondary | ICD-10-CM | POA: Diagnosis not present

## 2017-12-14 DIAGNOSIS — E559 Vitamin D deficiency, unspecified: Secondary | ICD-10-CM | POA: Diagnosis not present

## 2017-12-14 DIAGNOSIS — R635 Abnormal weight gain: Secondary | ICD-10-CM | POA: Diagnosis not present

## 2017-12-14 DIAGNOSIS — Z0000 Encounter for general adult medical examination without abnormal findings: Secondary | ICD-10-CM | POA: Diagnosis not present

## 2017-12-14 DIAGNOSIS — E538 Deficiency of other specified B group vitamins: Secondary | ICD-10-CM | POA: Diagnosis not present

## 2017-12-27 ENCOUNTER — Encounter: Payer: Self-pay | Admitting: Cardiovascular Disease

## 2017-12-27 ENCOUNTER — Ambulatory Visit (INDEPENDENT_AMBULATORY_CARE_PROVIDER_SITE_OTHER): Payer: Medicare Other | Admitting: Cardiovascular Disease

## 2017-12-27 VITALS — BP 140/84 | HR 74 | Ht 67.0 in | Wt 165.8 lb

## 2017-12-27 DIAGNOSIS — I10 Essential (primary) hypertension: Secondary | ICD-10-CM

## 2017-12-27 DIAGNOSIS — R55 Syncope and collapse: Secondary | ICD-10-CM | POA: Diagnosis not present

## 2017-12-27 NOTE — Progress Notes (Signed)
SUBJECTIVE: The patient returns for follow-up after undergoing cardiovascular testing performed for the evaluation of syncope.  Event monitoring demonstrated sinus rhythm and sinus tachycardia with PACs and PVCs with short ventricular runs noted.  There were no significant arrhythmias.  Echocardiogram report dated 05/20/2017 demonstrated vigorous left ventricular systolic function, LVEF 65 to 70%, severe focal basal hypertrophy, normal regional wall motion, normal diastolic function.  The patient denies any symptoms of chest pain, palpitations, shortness of breath, lightheadedness, dizziness, leg swelling, orthopnea, PND, and syncope.  She told me she is going to be wearing a 24-hour blood pressure monitor in January.  Her blood pressure is 140/84 today.  She checks it at home with a cuff and with her digital watch and blood pressures fluctuate but overall there appears to be fairly good control.  She has occasional headaches but denies blurry vision.  [facility].  She does cardio along with squats, lunges, and planks.  She is trying to reduce carbohydrate consumption and trying to lose weight.   Review of Systems: As per "subjective", otherwise negative.  No Known Allergies  Current Outpatient Medications  Medication Sig Dispense Refill  . amLODipine (NORVASC) 5 MG tablet Take 5 mg by mouth daily.    . Biotin 5000 MCG CAPS Take by mouth.    . Calcium Carbonate-Vitamin D (CALTRATE 600+D PO) Take by mouth.    . carvedilol (COREG) 12.5 MG tablet Take 12.5 mg by mouth 2 (two) times daily.    . Multiple Vitamin (MULTIVITAMIN) tablet Take 1 tablet by mouth daily.    . polycarbophil (FIBERCON) 625 MG tablet Take 625 mg by mouth daily.    . Wheat Dextrin (BENEFIBER PO) Take by mouth 3 (three) times daily.     No current facility-administered medications for this visit.     Past Medical History:  Diagnosis Date  . History of  adenomatous polyps of colon   . Hypertension   . Lichen sclerosus 09/4494   vulvar biopsy  . 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 Osteopenia 02/2016   T score -2.3 FRAX 5%/1% stable from prior DEXA    Past Surgical History:  Procedure Laterality Date  . COLONOSCOPY    . FOOT SURGERY  1980'S  . LAPAROSCOPIC TUBAL LIGATION  1982  . OOPHORECTOMY     BSO  . PELVIC LAPAROSCOPY  2013   BSO  . VAGINAL HYSTERECTOMY  1990    Social History   Socioeconomic History  . Marital status: Married    Spouse name: Not on file  . Number of children: Not on file  . Years of education: Not on file  . Highest education level: Not on file  Occupational History  . Not on file  Social Needs  . Financial resource strain: Not on file  . Food insecurity:    Worry: Not on file    Inability: Not on file  . Transportation needs:    Medical: Not on file    Non-medical: Not on file  Tobacco Use  . Smoking status: Never Smoker  . Smokeless tobacco: Never Used  Substance and Sexual Activity  . Alcohol use: No    Alcohol/week: 0.0 standard drinks  . Drug use: No  . Sexual activity: Yes    Birth control/protection: Surgical    Comment: 1st intercourse 72 yo-Fewer than 5 partners  Lifestyle  . Physical activity:  Days per week: Not on file    Minutes per session: Not on file  . Stress: Not on file  Relationships  . Social connections:    Talks on phone: Not on file    Gets together: Not on file    Attends religious service: Not on file    Active member of club or organization: Not on file    Attends meetings of clubs or organizations: Not on file    Relationship status: Not on file  . Intimate partner violence:    Fear of current or ex partner: Not on file    Emotionally abused: Not on file    Physically abused: Not on file    Forced sexual activity: Not on file  Other Topics Concern  . Not on file  Social History Narrative   He  is married   No alcohol tobacco drug use     Vitals:   12/27/17 0956  BP: 140/84  Pulse: 74  SpO2: 98%  Weight: 165 lb 12.8 oz (75.2 kg)  Height: 5\' 7"  (1.702 m)    Wt Readings from Last 3 Encounters:  12/27/17 165 lb 12.8 oz (75.2 kg)  11/01/17 165 lb 6 oz (75 kg)  09/10/17 167 lb (75.8 kg)     PHYSICAL EXAM General: NAD HEENT: Normal. Neck: No JVD, no thyromegaly. Lungs: Clear to auscultation bilaterally with normal respiratory effort. CV: Regular rate and rhythm, normal S1/S2, no S3/S4, no murmur. No pretibial or periankle edema.  No carotid bruit.   Abdomen: Soft, nontender, no distention.  Neurologic: Alert and oriented.  Psych: Normal affect. Skin: Normal. Musculoskeletal: No gross deformities.    ECG: Reviewed above under Subjective   Labs: Lab Results  Component Value Date/Time   K 3.6 05/20/2017 06:23 AM   BUN 18 05/20/2017 06:23 AM   CREATININE 0.84 05/20/2017 06:23 AM   ALT 28 05/19/2017 09:30 PM   HGB 11.1 (L) 05/20/2017 06:23 AM     Lipids: No results found for: LDLCALC, LDLDIRECT, CHOL, TRIG, HDL     ASSESSMENT AND PLAN: 1.  Syncope: Based on her preceding events including being outdoors when it was hot and humid all day and not drinking enough water, combined with a BUN of 28 and creatinine of 1.1, I suspect she had orthostatic hypotension induced syncope related to inadequate hydration.  She has had no symptom recurrence.  She has good exercise tolerance and thus I feel stress testing is not indicated.    Event monitoring did not demonstrate any arrhythmias which could have led to syncope.  At this point, I think watchful waiting is appropriate.  2.  Hypertension: Blood pressure is borderline elevated.  No changes to therapy.    Disposition: Follow up as needed   Nancy Beck, M.D., F.A.C.C.

## 2017-12-27 NOTE — Patient Instructions (Addendum)
Your physician wants you to follow-up in:  As needed with [facility]   Your physician recommends that you continue on your current medications as directed. Please refer to the Current Medication list given to you today.   If you need a refill on your cardiac medications before your next appointment, please call your pharmacy.     No lab work or tests today      Thank you for choosing [facility] !

## 2018-01-24 ENCOUNTER — Encounter: Payer: Self-pay | Admitting: Gynecology

## 2018-01-24 ENCOUNTER — Ambulatory Visit (INDEPENDENT_AMBULATORY_CARE_PROVIDER_SITE_OTHER): Payer: Medicare Other | Admitting: Gynecology

## 2018-01-24 VITALS — BP 116/74 | Ht 66.5 in | Wt 164.0 lb

## 2018-01-24 DIAGNOSIS — N952 Postmenopausal atrophic vaginitis: Secondary | ICD-10-CM

## 2018-01-24 DIAGNOSIS — M8580 Other specified disorders of bone density and structure, unspecified site: Secondary | ICD-10-CM

## 2018-01-24 DIAGNOSIS — Z01419 Encounter for gynecological examination (general) (routine) without abnormal findings: Secondary | ICD-10-CM

## 2018-01-24 NOTE — Patient Instructions (Signed)
Followup for bone density as scheduled. 

## 2018-01-24 NOTE — Progress Notes (Signed)
    Nancy Beck [birth year] [id number]        72 y.o.  [id number] for breast and pelvic exam.  Without gynecologic complaints  Past medical history,surgical history, problem list, medications, allergies, family history and social history were all reviewed and documented as reviewed in the EPIC chart.  ROS:  Performed with pertinent positives and negatives included in the history, assessment and plan.   Additional significant findings : None   Exam: [name] assistant Vitals:   01/24/18 1029  BP: 116/74  Weight: 164 lb (74.4 kg)  Height: 5' 6.5" (1.689 m)   Body mass index is 26.07 kg/m.  General appearance:  Normal affect, orientation and appearance. Skin: Grossly normal HEENT: Without gross lesions.  No cervical or supraclavicular adenopathy. Thyroid normal.  Lungs:  Clear without wheezing, rales or rhonchi Cardiac: RR, without RMG Abdominal:  Soft, nontender, without masses, guarding, rebound, organomegaly or hernia Breasts:  Examined lying and sitting without masses, retractions, discharge or axillary adenopathy. Pelvic:  Ext, BUS, Vagina: With atrophic changes.  Vitiligo changes from periclitoral hood to bilateral labia  Adnexa: Without masses or tenderness    Anus and perineum: Normal   Rectovaginal: Normal sphincter tone without palpated masses or tenderness.    Assessment/Plan:  73 y.o. [id number] female for breast and pelvic exam.   1. Postmenopausal.  No significant menopausal symptoms.  Status post [place] for leiomyoma.  Laparoscopic BSO 2013 for bilateral serous cystadenomas. 2. Pap smear 2012.  No Pap smear done today.  No history of abnormal Pap smears.  We both agree to stop screening per current screening guidelines. 3. History of lichen sclerosis biopsy-proven 2017.  Asymptomatic without treatment.  Continue to monitor. 4. Osteopenia.  DEXA 02/2016 T score -2.3 FRAX 5% / 1%.  Schedule DEXA beginning of this coming year and she agrees to do  so. 5. Colonoscopy 2019.  Repeat at their recommended interval. 6. Mammography 08/2017.  Continue with annual mammography when due.  Breast exam normal today. 7. Health maintenance.  No routine lab work done as patient does this elsewhere.  Follow-up 1 year, sooner as needed   [name] MD, 10:56 AM 01/24/2018

## 2018-01-30 DIAGNOSIS — R030 Elevated blood-pressure reading, without diagnosis of hypertension: Secondary | ICD-10-CM | POA: Diagnosis not present

## 2018-01-31 DIAGNOSIS — R030 Elevated blood-pressure reading, without diagnosis of hypertension: Secondary | ICD-10-CM | POA: Diagnosis not present

## 2018-04-10 ENCOUNTER — Other Ambulatory Visit: Payer: Self-pay

## 2018-04-10 ENCOUNTER — Other Ambulatory Visit: Payer: Self-pay | Admitting: Gynecology

## 2018-04-10 ENCOUNTER — Ambulatory Visit (INDEPENDENT_AMBULATORY_CARE_PROVIDER_SITE_OTHER): Payer: Medicare Other

## 2018-04-10 DIAGNOSIS — Z780 Asymptomatic menopausal state: Secondary | ICD-10-CM

## 2018-04-10 DIAGNOSIS — M8589 Other specified disorders of bone density and structure, multiple sites: Secondary | ICD-10-CM

## 2018-04-10 DIAGNOSIS — M8580 Other specified disorders of bone density and structure, unspecified site: Secondary | ICD-10-CM

## 2018-04-11 ENCOUNTER — Encounter: Payer: Self-pay | Admitting: Gynecology

## 2018-04-16 DIAGNOSIS — Z1331 Encounter for screening for depression: Secondary | ICD-10-CM | POA: Diagnosis not present

## 2018-04-16 DIAGNOSIS — R55 Syncope and collapse: Secondary | ICD-10-CM | POA: Diagnosis not present

## 2018-04-16 DIAGNOSIS — Z6825 Body mass index (BMI) 25.0-25.9, adult: Secondary | ICD-10-CM | POA: Diagnosis not present

## 2018-04-16 DIAGNOSIS — I10 Essential (primary) hypertension: Secondary | ICD-10-CM | POA: Diagnosis not present

## 2018-06-18 IMAGING — US US CAROTID DUPLEX BILAT
1 series · 13 of 24 positions shown · non-contrast
Comparison: None.

CLINICAL DATA: Syncope.

EXAM:
BILATERAL CAROTID DUPLEX ULTRASOUND
TECHNIQUE: Gray scale imaging, color Doppler and duplex ultrasound were
performed of bilateral carotid and vertebral arteries in the neck.

[Series 1: us carotid duplex bilat · 0.06mm/px · 13 of 70 slices shown]
[im 1/70]
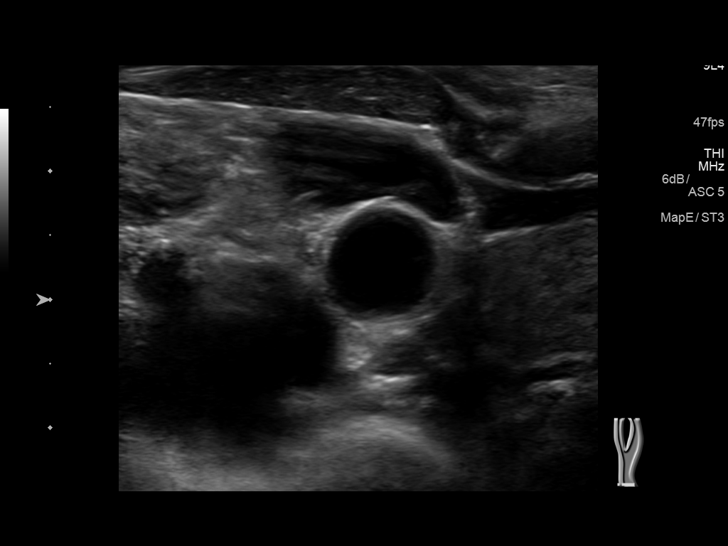
[im 7/70]
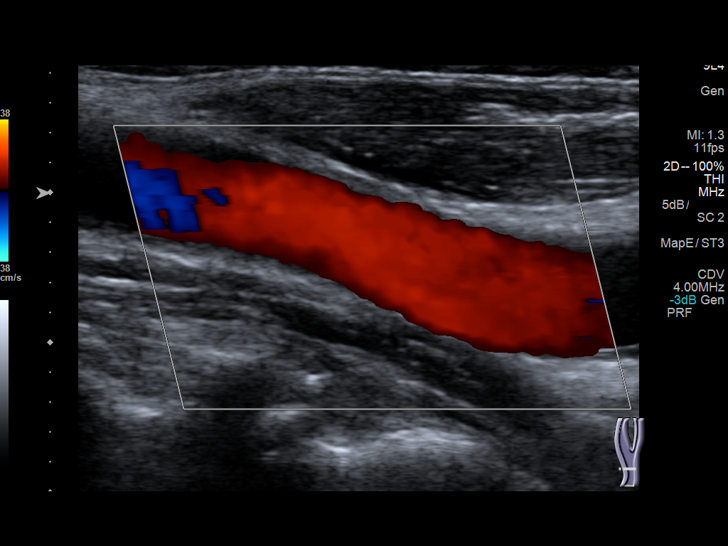
[im 13/70]
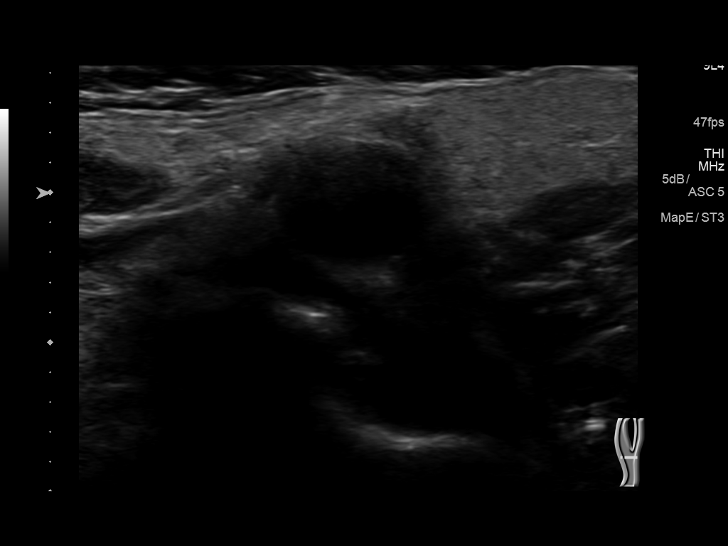
[im 19/70]
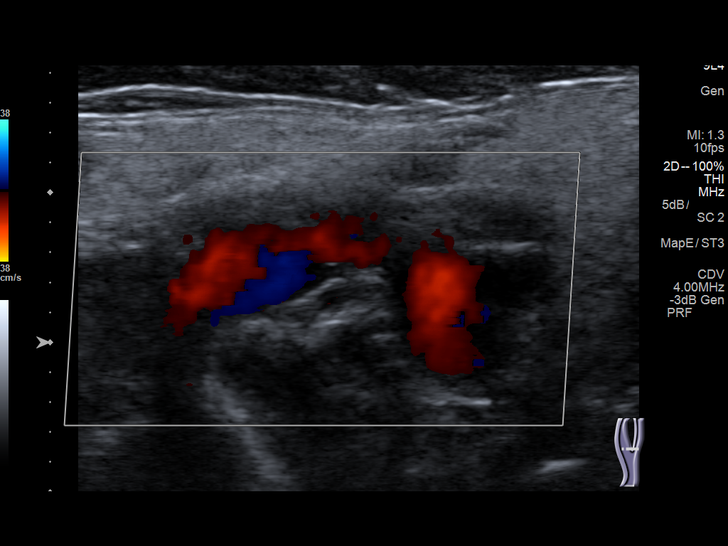
[im 25/70]
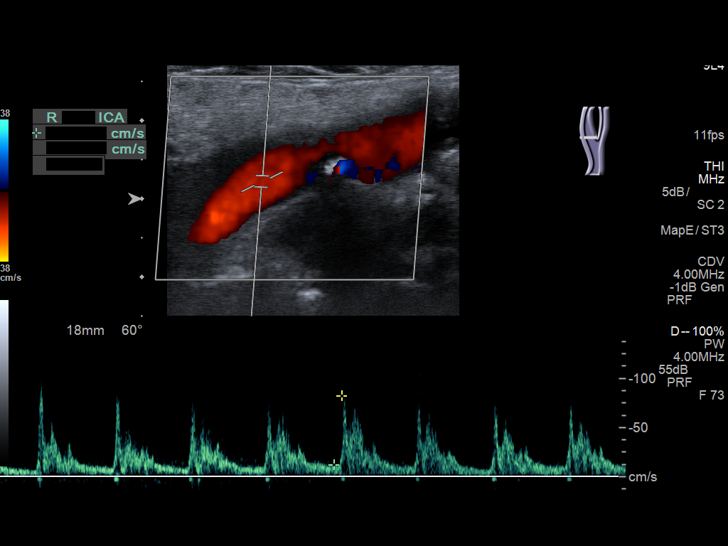
[im 31/70]
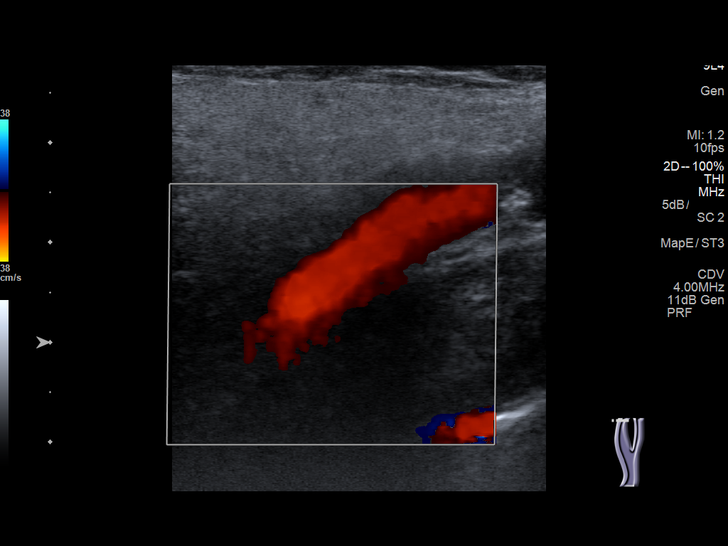
[im 37/70]
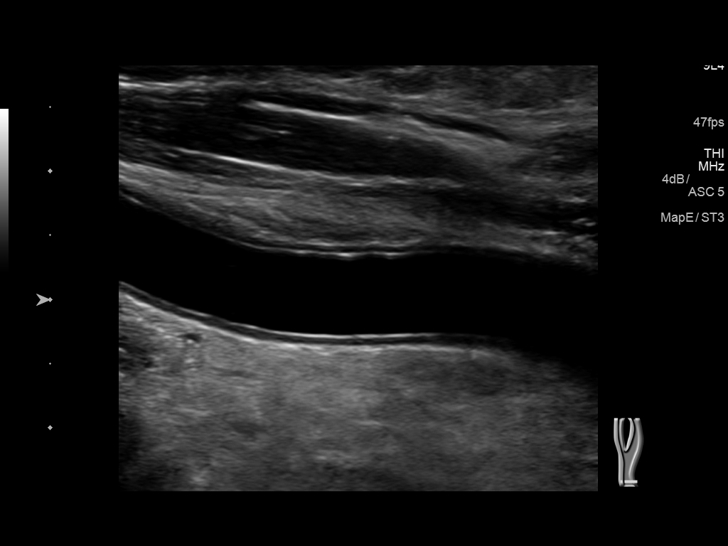
[im 40/70]
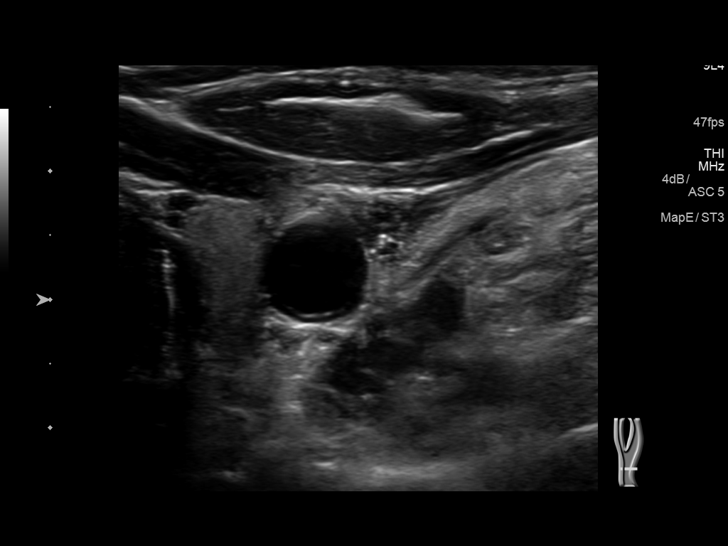
[im 46/70]
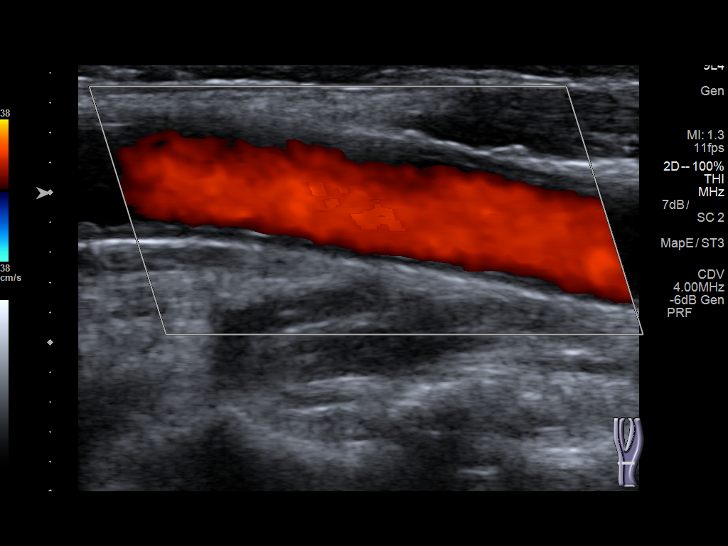
[im 52/70]
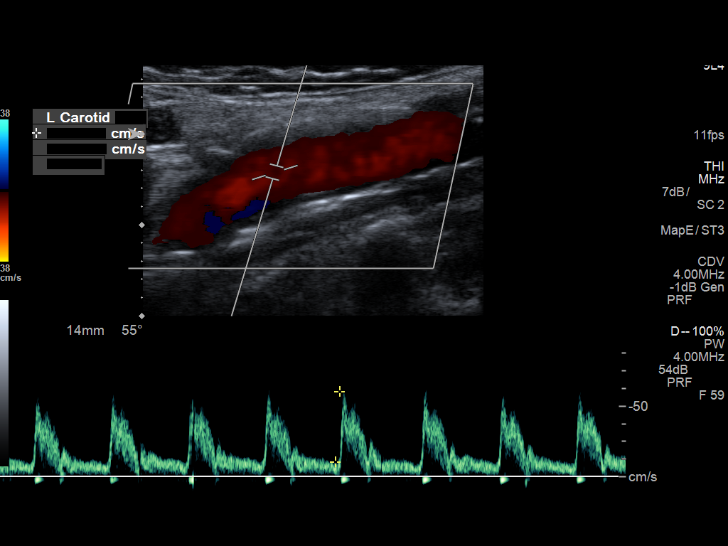
[im 58/70]
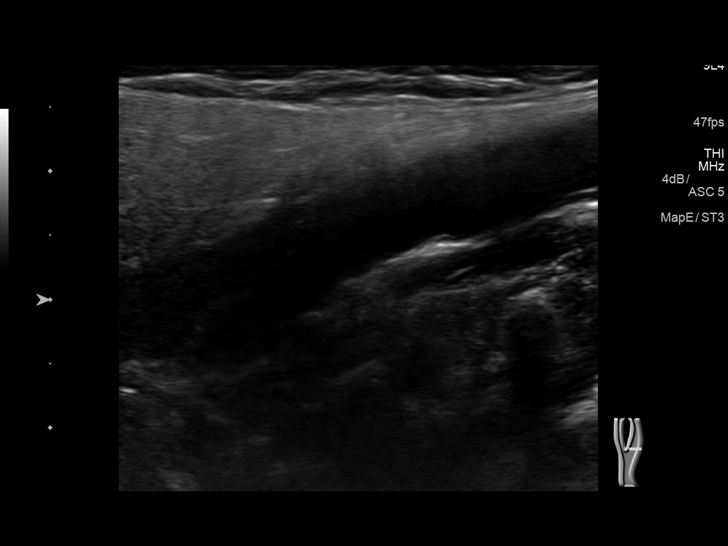
[im 64/70]
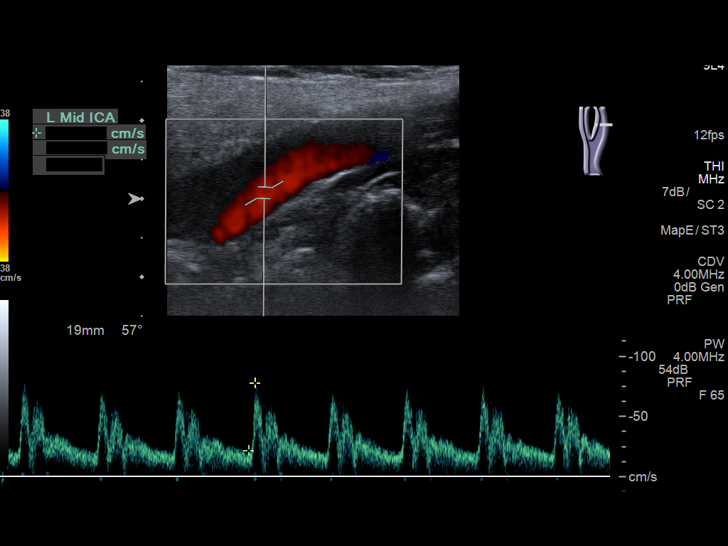
[im 70/70]
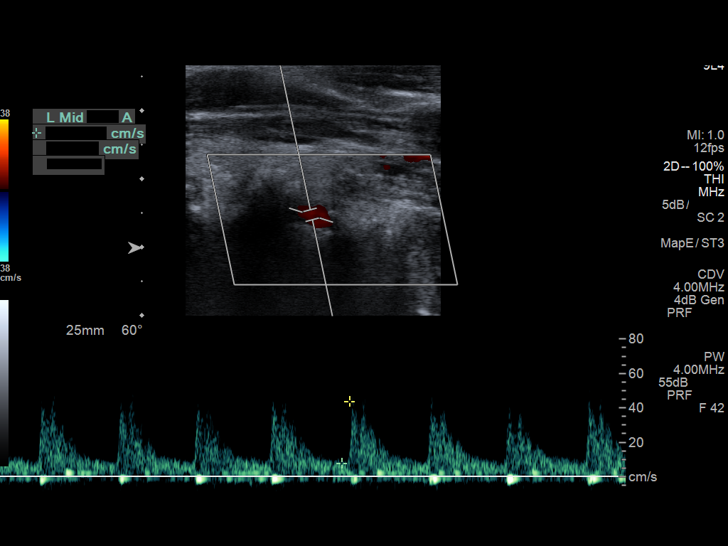

[13 of 24 positions shown; findings below may reference images not displayed]

FINDINGS: Criteria: Quantification of carotid stenosis is based on velocity
parameters that correlate the residual internal carotid diameter
with NASCET-based stenosis levels, using the diameter of the distal
internal carotid lumen as the denominator for stenosis measurement.

The following velocity measurements were obtained:

RIGHT
ICA: 111/33 cm/sec
CCA: 119/8 cm/sec

SYSTOLIC ICA/CCA RATIO:

ECA:  86 cm/sec

LEFT

ICA: 78/22 cm/sec

CCA: 106/14 cm/sec

SYSTOLIC ICA/CCA RATIO:

ECA:  91 cm/sec

RIGHT CAROTID ARTERY: Atherosclerosis in the CCA, bulb, proximal
ECA, and proximal ICA.

RIGHT VERTEBRAL ARTERY:  Antegrade flow

LEFT CAROTID ARTERY: Atherosclerosis in the CCA, proximal ECA,
carotid bulb, and ICA.

LEFT VERTEBRAL ARTERY:  Antegrade flow
IMPRESSION: 1. No rate limiting stenosis in the right ICA.
2. No rate limiting stenosis in the left ICA.
3. Soft and calcified plaque seen bilaterally in the CCA, bulb,
proximal ECA, and proximal ICA.
4. Antegrade flow in the vertebral arteries.

## 2018-10-30 ENCOUNTER — Encounter: Payer: Self-pay | Admitting: Gynecology

## 2018-11-06 DIAGNOSIS — I10 Essential (primary) hypertension: Secondary | ICD-10-CM | POA: Diagnosis not present

## 2018-12-04 ENCOUNTER — Encounter: Payer: Self-pay | Admitting: Gynecology

## 2018-12-04 DIAGNOSIS — Z1231 Encounter for screening mammogram for malignant neoplasm of breast: Secondary | ICD-10-CM | POA: Diagnosis not present

## 2018-12-04 DIAGNOSIS — Z803 Family history of malignant neoplasm of breast: Secondary | ICD-10-CM | POA: Diagnosis not present

## 2018-12-18 DIAGNOSIS — H35031 Hypertensive retinopathy, right eye: Secondary | ICD-10-CM | POA: Diagnosis not present

## 2018-12-23 DIAGNOSIS — E538 Deficiency of other specified B group vitamins: Secondary | ICD-10-CM | POA: Diagnosis not present

## 2018-12-23 DIAGNOSIS — I10 Essential (primary) hypertension: Secondary | ICD-10-CM | POA: Diagnosis not present

## 2018-12-23 DIAGNOSIS — M859 Disorder of bone density and structure, unspecified: Secondary | ICD-10-CM | POA: Diagnosis not present

## 2018-12-26 DIAGNOSIS — R82998 Other abnormal findings in urine: Secondary | ICD-10-CM | POA: Diagnosis not present

## 2018-12-27 DIAGNOSIS — E538 Deficiency of other specified B group vitamins: Secondary | ICD-10-CM | POA: Diagnosis not present

## 2018-12-27 DIAGNOSIS — Z1389 Encounter for screening for other disorder: Secondary | ICD-10-CM | POA: Diagnosis not present

## 2018-12-27 DIAGNOSIS — K5900 Constipation, unspecified: Secondary | ICD-10-CM | POA: Diagnosis not present

## 2018-12-27 DIAGNOSIS — Z1331 Encounter for screening for depression: Secondary | ICD-10-CM | POA: Diagnosis not present

## 2018-12-27 DIAGNOSIS — Z0000 Encounter for general adult medical examination without abnormal findings: Secondary | ICD-10-CM | POA: Diagnosis not present

## 2018-12-27 DIAGNOSIS — I10 Essential (primary) hypertension: Secondary | ICD-10-CM | POA: Diagnosis not present

## 2018-12-27 DIAGNOSIS — E559 Vitamin D deficiency, unspecified: Secondary | ICD-10-CM | POA: Diagnosis not present

## 2018-12-27 DIAGNOSIS — R634 Abnormal weight loss: Secondary | ICD-10-CM | POA: Diagnosis not present

## 2019-01-31 ENCOUNTER — Encounter: Payer: Medicare Other | Admitting: Gynecology

## 2019-02-14 ENCOUNTER — Other Ambulatory Visit: Payer: Self-pay

## 2019-02-17 ENCOUNTER — Other Ambulatory Visit: Payer: Self-pay

## 2019-02-17 ENCOUNTER — Ambulatory Visit (INDEPENDENT_AMBULATORY_CARE_PROVIDER_SITE_OTHER): Payer: Medicare Other | Admitting: Obstetrics and Gynecology

## 2019-02-17 ENCOUNTER — Encounter: Payer: Self-pay | Admitting: Obstetrics and Gynecology

## 2019-02-17 VITALS — BP 118/72 | Ht 66.0 in | Wt 156.0 lb

## 2019-02-17 DIAGNOSIS — Z01419 Encounter for gynecological examination (general) (routine) without abnormal findings: Secondary | ICD-10-CM

## 2019-02-17 NOTE — Patient Instructions (Addendum)
It was nice to meet you today! Please remember to schedule your annual mammogram this year. We will plan to repeat the DEXA scan next year.  Continue weight bearing exercise, and vitamin D/calcium intake.

## 2019-02-17 NOTE — Progress Notes (Signed)
   Nancy Beck Feb 13, 1945 [id number]  SUBJECTIVE:  74 y.o. G2P2002 female for annual routine gynecologic exam.  She has no gynecologic concerns.  Current Outpatient Medications  Medication Sig Dispense Refill  . amLODipine (NORVASC) 5 MG tablet Take 5 mg by mouth daily.    . Calcium Carbonate-Vitamin D (CALTRATE 600+D PO) Take by mouth.    . carvedilol (COREG) 12.5 MG tablet Take 12.5 mg by mouth 2 (two) times daily.     No current facility-administered medications for this visit.   Allergies: Patient has no known allergies.  No LMP recorded. Patient has had a hysterectomy.  Past medical history,surgical history, problem list, medications, allergies, family history and social history were all reviewed and documented as reviewed in the EPIC chart.  ROS:  Feeling well. No dyspnea or chest pain on exertion.  No abdominal pain, change in bowel habits, black or bloody stools.  No urinary tract symptoms. GYN ROS:  no abnormal bleeding, pelvic pain or discharge, no breast pain or new or enlarging lumps on self exam. No neurological complaints.    OBJECTIVE:  BP 118/72   Ht 5\' 6"  (1.676 m)   Wt 156 lb (70.8 kg)   BMI 25.18 kg/m  The patient appears well, alert, oriented x 3, in no distress. ENT normal.  Neck supple. No cervical or supraclavicular adenopathy or thyromegaly.  Lungs are clear, good air entry, no wheezes, rhonchi or rales. S1 and S2 normal, no murmurs, regular rate and rhythm.  Abdomen soft without tenderness, guarding, mass or organomegaly.  Neurological is normal, no focal findings.  BREAST EXAM: breasts appear normal, no suspicious masses, no skin or nipple changes or axillary nodes  PELVIC EXAM: VULVA: normal appearing vulva with no masses, tenderness or lesions, atrophic changes and vitiligo from periclitoral hood down to bilateral labia. VAGINA: normal appearing vagina with normal color and discharge, no lesions, CERVIX: surgically absent, UTERUS: surgically  absent, vaginal cuff well healed, ADNEXA: no masses  Chaperone: [name] present during the examination  ASSESSMENT:  74 y.o. [id number] here for annual gynecologic exam  PLAN:   1. Postmenopausal.  No significant menopausal symptoms.  Status post [place] for leiomyoma.  Laparoscopic BSO 2013 for bilateral serous cystadenomas. 2. Pap smear surveillance concluded as she had no prior history of abnormal Pap smears and a prior TVH.  Patient has been in agreement with this decision. 3. History of lichen sclerosis biopsy-proven 2017.  Mild symptoms without treatment, sufficient relief with use of petroleum jelly prn.  Continue to monitor. 4. Mammogram 11/2018. Will continue with annual mammography. Breast exam normal today. 5. Colonoscopy. 2019.  Recommended that she continue per the prescribed interval.   6. Osteopenia. DEXA 02/2018 was stable, T score -1.9. FRAX 5.3% / 1%.  Repeat DEXA in 2022. Continue calcium and vitamin D. 7. Health maintenance.  No lab work as she has this completed with her primary care provider.    Return annually or prn.  Larey Days MD  02/17/19

## 2019-03-31 ENCOUNTER — Ambulatory Visit: Payer: Federal, State, Local not specified - PPO

## 2019-04-05 ENCOUNTER — Ambulatory Visit: Payer: Federal, State, Local not specified - PPO

## 2019-04-18 DIAGNOSIS — H6980 Other specified disorders of Eustachian tube, unspecified ear: Secondary | ICD-10-CM | POA: Diagnosis not present

## 2019-12-26 DIAGNOSIS — Z23 Encounter for immunization: Secondary | ICD-10-CM | POA: Diagnosis not present

## 2019-12-30 DIAGNOSIS — I10 Essential (primary) hypertension: Secondary | ICD-10-CM | POA: Diagnosis not present

## 2019-12-30 DIAGNOSIS — E538 Deficiency of other specified B group vitamins: Secondary | ICD-10-CM | POA: Diagnosis not present

## 2019-12-30 DIAGNOSIS — M859 Disorder of bone density and structure, unspecified: Secondary | ICD-10-CM | POA: Diagnosis not present

## 2020-01-06 DIAGNOSIS — I10 Essential (primary) hypertension: Secondary | ICD-10-CM | POA: Diagnosis not present

## 2020-01-06 DIAGNOSIS — E559 Vitamin D deficiency, unspecified: Secondary | ICD-10-CM | POA: Diagnosis not present

## 2020-01-06 DIAGNOSIS — E538 Deficiency of other specified B group vitamins: Secondary | ICD-10-CM | POA: Diagnosis not present

## 2020-01-06 DIAGNOSIS — K5900 Constipation, unspecified: Secondary | ICD-10-CM | POA: Diagnosis not present

## 2020-01-06 DIAGNOSIS — I700 Atherosclerosis of aorta: Secondary | ICD-10-CM | POA: Diagnosis not present

## 2020-01-06 DIAGNOSIS — Z1212 Encounter for screening for malignant neoplasm of rectum: Secondary | ICD-10-CM | POA: Diagnosis not present

## 2020-01-06 DIAGNOSIS — Z0000 Encounter for general adult medical examination without abnormal findings: Secondary | ICD-10-CM | POA: Diagnosis not present

## 2020-01-06 DIAGNOSIS — R82998 Other abnormal findings in urine: Secondary | ICD-10-CM | POA: Diagnosis not present

## 2020-01-06 DIAGNOSIS — G4700 Insomnia, unspecified: Secondary | ICD-10-CM | POA: Diagnosis not present

## 2020-01-28 DIAGNOSIS — M25572 Pain in left ankle and joints of left foot: Secondary | ICD-10-CM | POA: Diagnosis not present

## 2020-01-30 ENCOUNTER — Other Ambulatory Visit: Payer: Self-pay

## 2020-01-30 ENCOUNTER — Encounter (HOSPITAL_BASED_OUTPATIENT_CLINIC_OR_DEPARTMENT_OTHER): Payer: Self-pay | Admitting: Orthopaedic Surgery

## 2020-01-31 ENCOUNTER — Other Ambulatory Visit (HOSPITAL_COMMUNITY)
Admission: RE | Admit: 2020-01-31 | Discharge: 2020-01-31 | Disposition: A | Discharge status: Home / Self Care | Payer: Medicare Other | Source: Ambulatory Visit | Attending: Orthopaedic Surgery | Admitting: Orthopaedic Surgery

## 2020-01-31 DIAGNOSIS — Z01812 Encounter for preprocedural laboratory examination: Secondary | ICD-10-CM | POA: Diagnosis not present

## 2020-01-31 DIAGNOSIS — Z20822 Contact with and (suspected) exposure to covid-19: Secondary | ICD-10-CM | POA: Diagnosis not present

## 2020-02-01 LAB — SARS CORONAVIRUS 2 (TAT 6-24 HRS): SARS Coronavirus 2: NEGATIVE

## 2020-02-02 ENCOUNTER — Ambulatory Visit (HOSPITAL_BASED_OUTPATIENT_CLINIC_OR_DEPARTMENT_OTHER): Payer: Medicare Other | Admitting: Certified Registered"

## 2020-02-02 ENCOUNTER — Encounter (HOSPITAL_BASED_OUTPATIENT_CLINIC_OR_DEPARTMENT_OTHER)
Admission: RE | Disposition: A | Discharge status: Home / Self Care | Payer: Self-pay | Source: Home / Self Care | Attending: Orthopaedic Surgery

## 2020-02-02 ENCOUNTER — Ambulatory Visit (HOSPITAL_BASED_OUTPATIENT_CLINIC_OR_DEPARTMENT_OTHER)
Admission: RE | Admit: 2020-02-02 | Discharge: 2020-02-02 | Disposition: A | Discharge status: Home / Self Care | Payer: Medicare Other | Attending: Orthopaedic Surgery | Admitting: Orthopaedic Surgery

## 2020-02-02 ENCOUNTER — Encounter (HOSPITAL_BASED_OUTPATIENT_CLINIC_OR_DEPARTMENT_OTHER): Payer: Self-pay | Admitting: Orthopaedic Surgery

## 2020-02-02 ENCOUNTER — Other Ambulatory Visit: Payer: Self-pay

## 2020-02-02 DIAGNOSIS — W19XXXA Unspecified fall, initial encounter: Secondary | ICD-10-CM | POA: Insufficient documentation

## 2020-02-02 DIAGNOSIS — S82842A Displaced bimalleolar fracture of left lower leg, initial encounter for closed fracture: Secondary | ICD-10-CM | POA: Diagnosis not present

## 2020-02-02 DIAGNOSIS — D649 Anemia, unspecified: Secondary | ICD-10-CM | POA: Diagnosis not present

## 2020-02-02 DIAGNOSIS — I10 Essential (primary) hypertension: Secondary | ICD-10-CM | POA: Diagnosis not present

## 2020-02-02 DIAGNOSIS — G8918 Other acute postprocedural pain: Secondary | ICD-10-CM | POA: Diagnosis not present

## 2020-02-02 HISTORY — PX: ORIF ANKLE FRACTURE: SHX5408

## 2020-02-02 SURGERY — OPEN REDUCTION INTERNAL FIXATION (ORIF) ANKLE FRACTURE
Anesthesia: General | Site: Ankle | Laterality: Left

## 2020-02-02 MED ORDER — PROMETHAZINE HCL 25 MG/ML IJ SOLN: 6.2500 mg | INTRAMUSCULAR | Status: DC | PRN

## 2020-02-02 MED ORDER — CELECOXIB 100 MG PO CAPS: 100.0000 mg | ORAL_CAPSULE | Freq: Two times a day (BID) | ORAL | 0 refills | Status: AC

## 2020-02-02 MED ORDER — MEPERIDINE HCL 25 MG/ML IJ SOLN: 6.2500 mg | INTRAMUSCULAR | Status: DC | PRN

## 2020-02-02 MED ORDER — FENTANYL CITRATE (PF) 100 MCG/2ML IJ SOLN
50.0000 ug | Freq: Once | INTRAMUSCULAR | Status: AC
  Administered 2020-02-02: 50 ug via INTRAVENOUS

## 2020-02-02 MED ORDER — OXYCODONE HCL 5 MG PO TABS: ORAL_TABLET | ORAL | 0 refills | Status: AC

## 2020-02-02 MED ORDER — ROPIVACAINE HCL 5 MG/ML IJ SOLN
INTRAMUSCULAR | Status: DC | PRN
  Administered 2020-02-02: 50 mL via PERINEURAL

## 2020-02-02 MED ORDER — OXYCODONE HCL 5 MG/5ML PO SOLN: 5.0000 mg | Freq: Once | ORAL | Status: DC | PRN

## 2020-02-02 MED ORDER — ONDANSETRON HCL 4 MG/2ML IJ SOLN
INTRAMUSCULAR | Status: DC | PRN
  Administered 2020-02-02: 4 mg via INTRAVENOUS

## 2020-02-02 MED ORDER — MIDAZOLAM HCL 2 MG/2ML IJ SOLN
1.0000 mg | Freq: Once | INTRAMUSCULAR | Status: AC
  Administered 2020-02-02: 2 mg via INTRAVENOUS

## 2020-02-02 MED ORDER — OXYCODONE HCL 5 MG PO TABS: 5.0000 mg | ORAL_TABLET | Freq: Once | ORAL | Status: DC | PRN

## 2020-02-02 MED ORDER — CEFAZOLIN SODIUM-DEXTROSE 2-4 GM/100ML-% IV SOLN
2.0000 g | INTRAVENOUS | Status: AC
  Administered 2020-02-02: 2 g via INTRAVENOUS

## 2020-02-02 MED ORDER — PROPOFOL 500 MG/50ML IV EMUL
INTRAVENOUS | Status: DC | PRN
  Administered 2020-02-02: 25 ug/kg/min via INTRAVENOUS

## 2020-02-02 MED ORDER — VANCOMYCIN HCL 1000 MG IV SOLR
INTRAVENOUS | Status: DC | PRN
  Administered 2020-02-02: 1000 mg via TOPICAL

## 2020-02-02 MED ORDER — LACTATED RINGERS IV SOLN: INTRAVENOUS | Status: DC

## 2020-02-02 MED ORDER — ASPIRIN 81 MG PO CHEW: 81.0000 mg | CHEWABLE_TABLET | Freq: Two times a day (BID) | ORAL | 0 refills | Status: AC

## 2020-02-02 MED ORDER — FENTANYL CITRATE (PF) 100 MCG/2ML IJ SOLN
INTRAMUSCULAR | Status: AC
  Filled 2020-02-02: qty 2

## 2020-02-02 MED ORDER — ONDANSETRON HCL 4 MG PO TABS: 4.0000 mg | ORAL_TABLET | Freq: Three times a day (TID) | ORAL | 1 refills | Status: AC | PRN

## 2020-02-02 MED ORDER — LIDOCAINE 2% (20 MG/ML) 5 ML SYRINGE
INTRAMUSCULAR | Status: DC | PRN
  Administered 2020-02-02: 30 mg via INTRAVENOUS

## 2020-02-02 MED ORDER — CEFAZOLIN SODIUM-DEXTROSE 2-4 GM/100ML-% IV SOLN
INTRAVENOUS | Status: AC
  Filled 2020-02-02: qty 100

## 2020-02-02 MED ORDER — DEXAMETHASONE SODIUM PHOSPHATE 10 MG/ML IJ SOLN
INTRAMUSCULAR | Status: DC | PRN
  Administered 2020-02-02: 4 mg via INTRAVENOUS

## 2020-02-02 MED ORDER — HYDROMORPHONE HCL 1 MG/ML IJ SOLN: 0.2500 mg | INTRAMUSCULAR | Status: DC | PRN

## 2020-02-02 MED ORDER — AMISULPRIDE (ANTIEMETIC) 5 MG/2ML IV SOLN: 10.0000 mg | Freq: Once | INTRAVENOUS | Status: DC | PRN

## 2020-02-02 MED ORDER — ACETAMINOPHEN 500 MG PO TABS: 1000.0000 mg | ORAL_TABLET | Freq: Three times a day (TID) | ORAL | 0 refills | Status: AC

## 2020-02-02 MED ORDER — PROPOFOL 10 MG/ML IV BOLUS
INTRAVENOUS | Status: DC | PRN
  Administered 2020-02-02: 150 mg via INTRAVENOUS

## 2020-02-02 MED ORDER — MIDAZOLAM HCL 2 MG/2ML IJ SOLN
INTRAMUSCULAR | Status: AC
  Filled 2020-02-02: qty 2

## 2020-02-02 SURGICAL SUPPLY — 76 items
BIT DRILL 2 CANN GRADUATED (BIT) ×1 IMPLANT
BIT DRILL 2.5 CANN LNG (BIT) ×1 IMPLANT
BIT DRILL 2.5 CANN STRL (BIT) ×1 IMPLANT
BIT DRILL 2.6 CANN (BIT) ×1 IMPLANT
BLADE SURG 10 STRL SS (BLADE) ×2 IMPLANT
BLADE SURG 15 STRL LF DISP TIS (BLADE) ×2 IMPLANT
BLADE SURG 15 STRL SS (BLADE) ×4
BNDG CMPR 9X4 STRL LF SNTH (GAUZE/BANDAGES/DRESSINGS)
BNDG COHESIVE 4X5 TAN STRL (GAUZE/BANDAGES/DRESSINGS) ×2 IMPLANT
BNDG ELASTIC 4X5.8 VLCR STR LF (GAUZE/BANDAGES/DRESSINGS) ×2 IMPLANT
BNDG ELASTIC 6X5.8 VLCR STR LF (GAUZE/BANDAGES/DRESSINGS) ×2 IMPLANT
BNDG ESMARK 4X9 LF (GAUZE/BANDAGES/DRESSINGS) IMPLANT
CLEANER CAUTERY TIP 5X5 PAD (MISCELLANEOUS) ×1 IMPLANT
CLSR STERI-STRIP ANTIMIC 1/2X4 (GAUZE/BANDAGES/DRESSINGS) ×2 IMPLANT
COVER BACK TABLE 60X90IN (DRAPES) ×2 IMPLANT
COVER WAND RF STERILE (DRAPES) IMPLANT
CUFF TOURN SGL QUICK 24 (TOURNIQUET CUFF)
CUFF TOURN SGL QUICK 34 (TOURNIQUET CUFF) ×2
CUFF TRNQT CYL 24X4X16.5-23 (TOURNIQUET CUFF) IMPLANT
CUFF TRNQT CYL 34X4.125X (TOURNIQUET CUFF) IMPLANT
DECANTER SPIKE VIAL GLASS SM (MISCELLANEOUS) IMPLANT
DRAPE EXTREMITY T 121X128X90 (DISPOSABLE) ×2 IMPLANT
DRAPE IMP U-DRAPE 54X76 (DRAPES) ×2 IMPLANT
DRAPE OEC MINIVIEW 54X84 (DRAPES) ×2 IMPLANT
DRAPE U-SHAPE 47X51 STRL (DRAPES) ×2 IMPLANT
DRSG PAD ABDOMINAL 8X10 ST (GAUZE/BANDAGES/DRESSINGS) ×2 IMPLANT
DURAPREP 26ML APPLICATOR (WOUND CARE) ×2 IMPLANT
ELECT REM PT RETURN 9FT ADLT (ELECTROSURGICAL) ×2
ELECTRODE REM PT RTRN 9FT ADLT (ELECTROSURGICAL) ×1 IMPLANT
GAUZE SPONGE 4X4 12PLY STRL (GAUZE/BANDAGES/DRESSINGS) ×2 IMPLANT
GAUZE XEROFORM 1X8 LF (GAUZE/BANDAGES/DRESSINGS) ×1 IMPLANT
GLOVE BIO SURGEON STRL SZ8 (GLOVE) ×2 IMPLANT
GLOVE ECLIPSE 8.0 STRL XLNG CF (GLOVE) ×2 IMPLANT
GLOVE SRG 8 PF TXTR STRL LF DI (GLOVE) ×1 IMPLANT
GLOVE SURG ENC MOIS LTX SZ6.5 (GLOVE) ×2 IMPLANT
GLOVE SURG UNDER POLY LF SZ6.5 (GLOVE) ×2 IMPLANT
GLOVE SURG UNDER POLY LF SZ8 (GLOVE) ×2
GOWN STRL REUS W/ TWL LRG LVL3 (GOWN DISPOSABLE) ×1 IMPLANT
GOWN STRL REUS W/TWL LRG LVL3 (GOWN DISPOSABLE) ×2
GOWN STRL REUS W/TWL XL LVL3 (GOWN DISPOSABLE) ×2 IMPLANT
GUIDEWIRE 1.35MM (WIRE) ×2 IMPLANT
NEEDLE HYPO 22GX1.5 SAFETY (NEEDLE) IMPLANT
NS IRRIG 1000ML POUR BTL (IV SOLUTION) ×2 IMPLANT
PACK BASIN DAY SURGERY FS (CUSTOM PROCEDURE TRAY) ×2 IMPLANT
PAD CAST 4YDX4 CTTN HI CHSV (CAST SUPPLIES) ×1 IMPLANT
PAD CLEANER CAUTERY TIP 5X5 (MISCELLANEOUS) ×1
PADDING CAST ABS 4INX4YD NS (CAST SUPPLIES) ×2
PADDING CAST ABS COTTON 4X4 ST (CAST SUPPLIES) ×2 IMPLANT
PADDING CAST COTTON 4X4 STRL (CAST SUPPLIES) ×2
PADDING CAST COTTON 6X4 STRL (CAST SUPPLIES) ×2 IMPLANT
PENCIL SMOKE EVACUATOR (MISCELLANEOUS) ×2 IMPLANT
PLATE DISTAL FIBULA 4H LOCKING (Plate) ×1 IMPLANT
SCREW LOCK T15 FT 16X3.5XST (Screw) IMPLANT
SCREW LOCKING 2.7X14MM (Screw) ×1 IMPLANT
SCREW LOCKING 2.7X16MM (Screw) ×2 IMPLANT
SCREW LOCKING 3.5X16MM (Screw) ×4 IMPLANT
SCREW LOW PROFILE 3.5X14 (Screw) ×2 IMPLANT
SCREW LOW PROFILE CANN 4.0X45 (Screw) ×2 IMPLANT
SLEEVE SCD COMPRESS KNEE MED (MISCELLANEOUS) ×1 IMPLANT
SPLINT FAST PLASTER 5X30 (CAST SUPPLIES) ×20
SPLINT PLASTER CAST FAST 5X30 (CAST SUPPLIES) ×20 IMPLANT
SPONGE LAP 18X18 RF (DISPOSABLE) ×2 IMPLANT
SUCTION FRAZIER HANDLE 10FR (MISCELLANEOUS) ×2
SUCTION TUBE FRAZIER 10FR DISP (MISCELLANEOUS) ×1 IMPLANT
SUT ETHILON 3 0 PS 1 (SUTURE) ×2 IMPLANT
SUT MNCRL AB 4-0 PS2 18 (SUTURE) IMPLANT
SUT VIC AB 0 CT1 27 (SUTURE) ×2
SUT VIC AB 0 CT1 27XBRD ANBCTR (SUTURE) ×1 IMPLANT
SUT VIC AB 3-0 SH 27 (SUTURE) ×4
SUT VIC AB 3-0 SH 27X BRD (SUTURE) ×1 IMPLANT
SYR BULB EAR ULCER 3OZ GRN STR (SYRINGE) ×2 IMPLANT
SYR CONTROL 10ML LL (SYRINGE) IMPLANT
TOWEL GREEN STERILE FF (TOWEL DISPOSABLE) ×4 IMPLANT
TUBE CONNECTING 20X1/4 (TUBING) ×2 IMPLANT
UNDERPAD 30X36 HEAVY ABSORB (UNDERPADS AND DIAPERS) ×2 IMPLANT
YANKAUER SUCT BULB TIP NO VENT (SUCTIONS) ×2 IMPLANT

## 2020-02-02 NOTE — Transfer of Care (Signed)
Immediate Anesthesia Transfer of Care Note  Patient: [place]  Procedure(s) Performed: OPEN REDUCTION INTERNAL FIXATION (ORIF) BIMALLEOLAR ANKLE FRACTURE (Left Ankle)  Patient Location: PACU  Anesthesia Type:GA combined with regional for post-op pain  Level of Consciousness: drowsy and patient cooperative  Airway & Oxygen Therapy: Patient Spontanous Breathing and Patient connected to face mask oxygen  Post-op Assessment: Report given to RN and Post -op Vital signs reviewed and stable  Post vital signs: Reviewed and stable  Last Vitals:  Vitals Value Taken Time  BP 119/64 02/02/20 1349  Temp    Pulse 57 02/02/20 1350  Resp 14 02/02/20 1350  SpO2 98 % 02/02/20 1350    Last Pain:  Vitals:   02/02/20 1020  TempSrc: Oral  PainSc: 0-No pain      Patients Stated Pain Goal: 4 (62/83/66 2947)  Complications: No complications documented.

## 2020-02-02 NOTE — Anesthesia Postprocedure Evaluation (Signed)
Anesthesia Post Note  Patient: Nancy Beck  Procedure(s) Performed: OPEN REDUCTION INTERNAL FIXATION (ORIF) BIMALLEOLAR ANKLE FRACTURE (Left Ankle)     Patient location during evaluation: PACU Anesthesia Type: General Level of consciousness: awake and alert Pain management: pain level controlled Vital Signs Assessment: post-procedure vital signs reviewed and stable Respiratory status: spontaneous breathing, nonlabored ventilation and respiratory function stable Cardiovascular status: blood pressure returned to baseline and stable Postop Assessment: no apparent nausea or vomiting Anesthetic complications: no   No complications documented.  Last Vitals:  Vitals:   02/02/20 1415 02/02/20 1447  BP: 124/65 (!) 130/54  Pulse: (!) 59 65  Resp: 15 18  Temp:  (!) 36.3 C  SpO2: 100% 96%    Last Pain:  Vitals:   02/02/20 1447  TempSrc:   PainSc: 0-No pain                 Lynda Rainwater

## 2020-02-02 NOTE — Interval H&P Note (Signed)
History and Physical Interval Note:  02/02/2020 12:14 PM  [name]  has presented today for surgery, with the diagnosis of LEFT ANKLE FRACTURE.  The various methods of treatment have been discussed with the patient and family. After consideration of risks, benefits and other options for treatment, the patient has consented to  Procedure(s): OPEN REDUCTION INTERNAL FIXATION (ORIF) BIMALLEOLAR ANKLE FRACTURE (Left) as a surgical intervention.  The patient's history has been reviewed, patient examined, no change in status, stable for surgery.  I have reviewed the patient's chart and labs.  Questions were answered to the patient's satisfaction.     Nancy Beck

## 2020-02-02 NOTE — Anesthesia Procedure Notes (Signed)
Procedure Name: LMA Insertion Date/Time: 02/02/2020 12:36 PM Performed by: Signe Colt, CRNA Pre-anesthesia Checklist: Patient identified, Emergency Drugs available, Suction available and Patient being monitored Patient Re-evaluated:Patient Re-evaluated prior to induction Oxygen Delivery Method: Circle System Utilized Preoxygenation: Pre-oxygenation with 100% oxygen Induction Type: IV induction Ventilation: Mask ventilation without difficulty LMA: LMA inserted LMA Size: 4.0 Number of attempts: 1 Airway Equipment and Method: bite block Placement Confirmation: positive ETCO2 Tube secured with: Tape Dental Injury: Teeth and Oropharynx as per pre-operative assessment

## 2020-02-02 NOTE — H&P (View-Only) (Signed)
Assisted Dr. Miller with left, ultrasound guided, popliteal, adductor canal block. Side rails up, monitors on throughout procedure. See vital signs in flow sheet. Tolerated Procedure well. 

## 2020-02-02 NOTE — Progress Notes (Signed)
Assisted Dr. Miller with left, ultrasound guided, popliteal, adductor canal block. Side rails up, monitors on throughout procedure. See vital signs in flow sheet. Tolerated Procedure well. 

## 2020-02-02 NOTE — Anesthesia Procedure Notes (Signed)
Anesthesia Regional Block: Popliteal block   Pre-Anesthetic Checklist: ,, timeout performed, Correct Patient, Correct Site, Correct Laterality, Correct Procedure, Correct Position, site marked, Risks and benefits discussed,  Surgical consent,  Pre-op evaluation,  At surgeon's request and post-op pain management  Laterality: Left  Prep: chloraprep       Needles:  Injection technique: Single-shot  Needle Type: Stimiplex     Needle Length: 9cm  Needle Gauge: 21     Additional Needles:   Procedures:,,,, ultrasound used (permanent image in chart),,,,  Narrative:  Start time: 02/02/2020 10:59 AM End time: 02/02/2020 11:04 AM Injection made incrementally with aspirations every 5 mL.  Performed by: Personally  Anesthesiologist: Lynda Rainwater, MD

## 2020-02-02 NOTE — Op Note (Signed)
Orthopaedic Surgery Operative Note (CSN: [id number])  Nancy Beck  12-11-1945 Date of Surgery: 02/02/2020   Diagnoses:  Left bimalleolar ankle fracture  Procedure: Left bimalleolar ankle fracture open reduction internal fixation   Operative Finding Successful completion of the planned procedure.  Moderate bone quality and best but robust fixation.  Closed skin with nylon due to patient's level of swelling which was still amenable to repair however was more swollen than we would feel comfortable with with dissolving sutures.  Post-operative plan: The patient will be touchdown weightbearing in a splint for 2 weeks with sutures to be removed at that point and transition to progressive weightbearing in a boot.  The patient will be discharged home.  DVT prophylaxis Aspirin 81 mg twice daily for 6 weeks.   Pain control with PRN pain medication preferring oral medicines.  Follow up plan will be scheduled in approximately 14 days for incision check and XR.  Post-Op Diagnosis: Same Surgeons:Primary: Hiram Gash, MD Assistants:Caroline McBane PA-C Location: [place] [facility] OR ROOM 1 Anesthesia: General with regional anesthesia Antibiotics: Ancef 2 g with local vancomycin powder 1 g at the surgical site Tourniquet time: 35 minutes Estimated Blood Loss: Minimal Complications: None Specimens: None Implants: Implant Name Type Inv. Item Serial No. Manufacturer Lot No. LRB No. Used Action  SCREW LOW PROFILE CANN 4.0X45 - [id number] Screw SCREW LOW PROFILE CANN 4.0X45  [employer] ON STERILE TRAY Left 2 Implanted  PLATE DISTAL FIBULA 4H LOCKING - [id number] Plate PLATE DISTAL FIBULA 4H LOCKING  [employer] ON STERILE TRAY Left 1 Implanted  SCREW LOW PROFILE 3.5X14 - [id number] Screw SCREW LOW PROFILE 3.5X14  [employer] ON STERILE TRAY Left 2 Implanted  SCREW LOCKING 2.7X16MM - [id number] Screw SCREW LOCKING 2.7X16MM  [employer] ON STERILE TRAY Left 2 Implanted  SCREW LOCKING 2.7X14MM - [id number] Screw SCREW  LOCKING 2.7X14MM  [employer] ON STERILE TRAY Left 1 Implanted  SCREW LOCKING 3.5X16MM - [id number] Screw SCREW LOCKING 3.5X16MM  [employer] ON STERILE TRAY Left 2 Implanted    Indications for Surgery:   Nancy Beck is a 75 y.o. female with fall resulting in a bimalleolar displaced ankle fracture with significant diastases of the medial malleolus.  Benefits and risks of operative and nonoperative management were discussed prior to surgery with patient/guardian(s) and informed consent form was completed.  Specific risks including infection, need for additional surgery, malunion, nonunion, wound healing and stiffness issues   Procedure:   The patient was identified properly. Informed consent was obtained and the surgical site was marked. The patient was taken up to suite where general anesthesia was induced.  The patient was positioned on a regular bed with bone foam.  The left ankle was prepped and draped in the usual sterile fashion.  Timeout was performed before the beginning of the case.  Tourniquet was used for the above duration.  We began with our ORIF of the fibula. A longitudinal approach was made along the lateral border of the fibula centered at the fracture site. We dissected down taking care to avoid the superficial peroneal nerve which crossed proximal to our incision. We encountered the fracture site and noted a short oblique fracture. The bone quality was moderate to poor. We are able to reduce it anatomically . We identified that the fracture was not amenable to lag screw fixation.  We then filled 3 holes distal to the fracture site and 4 holes proximal achieving bicortical fixation with all screws.  We confirmed anatomic reduction of  fluoroscopy and then turned our attention to the medial side.  An oblique incision was made over the anterior aspect of the medial malleolus were able to identify the fracture site itself. A point the fracture site was cleared of interposed  periosteum and we were able to obtain an anatomic reduction was held with point-to-point clamp. At this point we placed 2 partially threaded 45 mm cannulated screws across the fracture site achieving good purchase and good compression with each screw.    We irrigated the wound copiously before placing local antibiotic as listed above.  We closed the incision in a multilayer fashion with nonabsorbable suture.  Sterile dressing was placed.  Well-padded short leg splint was placed.  Patient was awoken taken to PACU in stable condition.  Noemi Chapel, PA-C, present and scrubbed throughout the case, critical for completion in a timely fashion, and for retraction, instrumentation, closure.

## 2020-02-02 NOTE — Anesthesia Preprocedure Evaluation (Signed)
Anesthesia Evaluation  Patient identified by MRN, date of birth, ID band Patient awake    Reviewed: Allergy & Precautions, H&P , NPO status , Patient's Chart, lab work & pertinent test results  Airway Mallampati: II  TM Distance: >3 FB Neck ROM: Full    Dental no notable dental hx.    Pulmonary neg pulmonary ROS,    Pulmonary exam normal breath sounds clear to auscultation       Cardiovascular hypertension, Pt. on medications and Pt. on home beta blockers Normal cardiovascular exam Rhythm:Regular Rate:Normal     Neuro/Psych negative neurological ROS  negative psych ROS   GI/Hepatic negative GI ROS, Neg liver ROS,   Endo/Other  negative endocrine ROS  Renal/GU negative Renal ROS  negative genitourinary   Musculoskeletal negative musculoskeletal ROS (+)   Abdominal   Peds negative pediatric ROS (+)  Hematology negative hematology ROS (+)   Anesthesia Other Findings   Reproductive/Obstetrics negative OB ROS                             Anesthesia Physical  Anesthesia Plan  ASA: II  Anesthesia Plan: General   Post-op Pain Management:  Regional for Post-op pain   Induction: Intravenous  PONV Risk Score and Plan: 3 and Ondansetron, Dexamethasone, Midazolam and Treatment may vary due to age or medical condition  Airway Management Planned: Oral ETT  Additional Equipment:   Intra-op Plan:   Post-operative Plan: Extubation in OR  Informed Consent: I have reviewed the patients History and Physical, chart, labs and discussed the procedure including the risks, benefits and alternatives for the proposed anesthesia with the patient or authorized representative who has indicated his/her understanding and acceptance.     Dental advisory given  Plan Discussed with: CRNA  Anesthesia Plan Comments:         Anesthesia Quick Evaluation

## 2020-02-02 NOTE — Discharge Instructions (Signed)
Regional Anesthesia Blocks ? ?1. Numbness or the inability to move the "blocked" extremity may last from 3-48 hours after placement. The length of time depends on the medication injected and your individual response to the medication. If the numbness is not going away after 48 hours, call your surgeon. ? ?2. The extremity that is blocked will need to be protected until the numbness is gone and the  Strength has returned. Because you cannot feel it, you will need to take extra care to avoid injury. Because it may be weak, you may have difficulty moving it or using it. You may not know what position it is in without looking at it while the block is in effect. ? ?3. For blocks in the legs and feet, returning to weight bearing and walking needs to be done carefully. You will need to wait until the numbness is entirely gone and the strength has returned. You should be able to move your leg and foot normally before you try and bear weight or walk. You will need someone to be with you when you first try to ensure you do not fall and possibly risk injury. ? ?4. Bruising and tenderness at the needle site are common side effects and will resolve in a few days. ? ?5. Persistent numbness or new problems with movement should be communicated to the surgeon or the [facility] ([phone])/ [facility] ([phone]).  ? ?Post Anesthesia Home Care Instructions ? ?Activity: ?Get plenty of rest for the remainder of the day. A responsible individual must stay with you for 24 hours following the procedure.  ?For the next 24 hours, DO NOT: ?-Drive a car ?-Operate machinery ?-Drink alcoholic beverages ?-Take any medication unless instructed by your physician ?-Make any legal decisions or sign important papers. ? ?Meals: ?Start with liquid foods such as gelatin or soup. Progress to regular foods as tolerated. Avoid greasy, spicy, heavy foods. If nausea and/or vomiting occur, drink only clear liquids until the  nausea and/or vomiting subsides. Call your physician if vomiting continues. ? ?Special Instructions/Symptoms: ?Your throat may feel dry or sore from the anesthesia or the breathing tube placed in your throat during surgery. If this causes discomfort, gargle with warm salt water. The discomfort should disappear within 24 hours. ? ?If you had a scopolamine patch placed behind your ear for the management of post- operative nausea and/or vomiting: ? ?1. The medication in the patch is effective for 72 hours, after which it should be removed.  Wrap patch in a tissue and discard in the trash. Wash hands thoroughly with soap and water. ?2. You may remove the patch earlier than 72 hours if you experience unpleasant side effects which may include dry mouth, dizziness or visual disturbances. ?3. Avoid touching the patch. Wash your hands with soap and water after contact with the patch. ?    ?

## 2020-02-02 NOTE — Anesthesia Procedure Notes (Signed)
Anesthesia Regional Block: Adductor canal block   Pre-Anesthetic Checklist: ,, timeout performed, Correct Patient, Correct Site, Correct Laterality, Correct Procedure, Correct Position, site marked, Risks and benefits discussed,  Surgical consent,  Pre-op evaluation,  At surgeon's request and post-op pain management  Laterality: Left  Prep: chloraprep       Needles:  Injection technique: Single-shot  Needle Type: Stimiplex     Needle Length: 9cm  Needle Gauge: 21     Additional Needles:   Procedures:,,,, ultrasound used (permanent image in chart),,,,  Narrative:  Start time: 02/02/2020 10:58 AM End time: 02/02/2020 11:03 AM Injection made incrementally with aspirations every 5 mL.  Performed by: Personally  Anesthesiologist: Lynda Rainwater, MD

## 2020-02-03 ENCOUNTER — Encounter (HOSPITAL_BASED_OUTPATIENT_CLINIC_OR_DEPARTMENT_OTHER): Payer: Self-pay | Admitting: Orthopaedic Surgery

## 2020-02-10 DIAGNOSIS — S82842D Displaced bimalleolar fracture of left lower leg, subsequent encounter for closed fracture with routine healing: Secondary | ICD-10-CM | POA: Diagnosis not present

## 2020-02-18 ENCOUNTER — Encounter: Payer: Self-pay | Admitting: Obstetrics and Gynecology

## 2020-02-18 ENCOUNTER — Ambulatory Visit (INDEPENDENT_AMBULATORY_CARE_PROVIDER_SITE_OTHER): Payer: Medicare Other | Admitting: Obstetrics and Gynecology

## 2020-02-18 ENCOUNTER — Other Ambulatory Visit: Payer: Self-pay

## 2020-02-18 VITALS — BP 120/78 | Ht 66.0 in | Wt 156.0 lb

## 2020-02-18 DIAGNOSIS — N644 Mastodynia: Secondary | ICD-10-CM

## 2020-02-18 DIAGNOSIS — M8588 Other specified disorders of bone density and structure, other site: Secondary | ICD-10-CM | POA: Diagnosis not present

## 2020-02-18 DIAGNOSIS — M8580 Other specified disorders of bone density and structure, unspecified site: Secondary | ICD-10-CM

## 2020-02-18 DIAGNOSIS — Z01419 Encounter for gynecological examination (general) (routine) without abnormal findings: Secondary | ICD-10-CM | POA: Diagnosis not present

## 2020-02-18 MED ORDER — TRIAMCINOLONE ACETONIDE 0.1 % EX OINT: 1.0000 "application " | TOPICAL_OINTMENT | Freq: Two times a day (BID) | CUTANEOUS | 1 refills | Status: DC | PRN
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
## 2020-02-18 NOTE — Addendum Note (Signed)
Addended by: Joseph Pierini D on: 02/18/2020 12:11 PM   Modules accepted: Orders

## 2020-02-23 DIAGNOSIS — M25472 Effusion, left ankle: Secondary | ICD-10-CM | POA: Diagnosis not present

## 2020-02-23 DIAGNOSIS — R262 Difficulty in walking, not elsewhere classified: Secondary | ICD-10-CM | POA: Diagnosis not present

## 2020-02-23 DIAGNOSIS — M25672 Stiffness of left ankle, not elsewhere classified: Secondary | ICD-10-CM | POA: Diagnosis not present

## 2020-02-23 DIAGNOSIS — M6281 Muscle weakness (generalized): Secondary | ICD-10-CM | POA: Diagnosis not present

## 2020-02-26 DIAGNOSIS — M6281 Muscle weakness (generalized): Secondary | ICD-10-CM | POA: Diagnosis not present

## 2020-02-26 DIAGNOSIS — M25672 Stiffness of left ankle, not elsewhere classified: Secondary | ICD-10-CM | POA: Diagnosis not present

## 2020-02-26 DIAGNOSIS — M25472 Effusion, left ankle: Secondary | ICD-10-CM | POA: Diagnosis not present

## 2020-02-26 DIAGNOSIS — R262 Difficulty in walking, not elsewhere classified: Secondary | ICD-10-CM | POA: Diagnosis not present

## 2020-03-01 DIAGNOSIS — R262 Difficulty in walking, not elsewhere classified: Secondary | ICD-10-CM | POA: Diagnosis not present

## 2020-03-01 DIAGNOSIS — M25672 Stiffness of left ankle, not elsewhere classified: Secondary | ICD-10-CM | POA: Diagnosis not present

## 2020-03-01 DIAGNOSIS — M25472 Effusion, left ankle: Secondary | ICD-10-CM | POA: Diagnosis not present

## 2020-03-01 DIAGNOSIS — M6281 Muscle weakness (generalized): Secondary | ICD-10-CM | POA: Diagnosis not present

## 2020-03-03 DIAGNOSIS — M25672 Stiffness of left ankle, not elsewhere classified: Secondary | ICD-10-CM | POA: Diagnosis not present

## 2020-03-03 DIAGNOSIS — M25472 Effusion, left ankle: Secondary | ICD-10-CM | POA: Diagnosis not present

## 2020-03-03 DIAGNOSIS — R262 Difficulty in walking, not elsewhere classified: Secondary | ICD-10-CM | POA: Diagnosis not present

## 2020-03-03 DIAGNOSIS — M6281 Muscle weakness (generalized): Secondary | ICD-10-CM | POA: Diagnosis not present

## 2020-03-08 DIAGNOSIS — M25672 Stiffness of left ankle, not elsewhere classified: Secondary | ICD-10-CM | POA: Diagnosis not present

## 2020-03-08 DIAGNOSIS — R262 Difficulty in walking, not elsewhere classified: Secondary | ICD-10-CM | POA: Diagnosis not present

## 2020-03-08 DIAGNOSIS — M25472 Effusion, left ankle: Secondary | ICD-10-CM | POA: Diagnosis not present

## 2020-03-08 DIAGNOSIS — M6281 Muscle weakness (generalized): Secondary | ICD-10-CM | POA: Diagnosis not present

## 2020-03-11 DIAGNOSIS — R262 Difficulty in walking, not elsewhere classified: Secondary | ICD-10-CM | POA: Diagnosis not present

## 2020-03-11 DIAGNOSIS — M25472 Effusion, left ankle: Secondary | ICD-10-CM | POA: Diagnosis not present

## 2020-03-11 DIAGNOSIS — M25672 Stiffness of left ankle, not elsewhere classified: Secondary | ICD-10-CM | POA: Diagnosis not present

## 2020-03-11 DIAGNOSIS — M6281 Muscle weakness (generalized): Secondary | ICD-10-CM | POA: Diagnosis not present

## 2020-03-15 DIAGNOSIS — M6281 Muscle weakness (generalized): Secondary | ICD-10-CM | POA: Diagnosis not present

## 2020-03-15 DIAGNOSIS — M25472 Effusion, left ankle: Secondary | ICD-10-CM | POA: Diagnosis not present

## 2020-03-15 DIAGNOSIS — R262 Difficulty in walking, not elsewhere classified: Secondary | ICD-10-CM | POA: Diagnosis not present

## 2020-03-15 DIAGNOSIS — M25672 Stiffness of left ankle, not elsewhere classified: Secondary | ICD-10-CM | POA: Diagnosis not present

## 2020-03-16 DIAGNOSIS — M25472 Effusion, left ankle: Secondary | ICD-10-CM | POA: Diagnosis not present

## 2020-03-18 DIAGNOSIS — M25472 Effusion, left ankle: Secondary | ICD-10-CM | POA: Diagnosis not present

## 2020-03-18 DIAGNOSIS — R262 Difficulty in walking, not elsewhere classified: Secondary | ICD-10-CM | POA: Diagnosis not present

## 2020-03-18 DIAGNOSIS — M6281 Muscle weakness (generalized): Secondary | ICD-10-CM | POA: Diagnosis not present

## 2020-03-18 DIAGNOSIS — M25672 Stiffness of left ankle, not elsewhere classified: Secondary | ICD-10-CM | POA: Diagnosis not present

## 2020-03-22 DIAGNOSIS — R262 Difficulty in walking, not elsewhere classified: Secondary | ICD-10-CM | POA: Diagnosis not present

## 2020-03-22 DIAGNOSIS — M25672 Stiffness of left ankle, not elsewhere classified: Secondary | ICD-10-CM | POA: Diagnosis not present

## 2020-03-22 DIAGNOSIS — M25472 Effusion, left ankle: Secondary | ICD-10-CM | POA: Diagnosis not present

## 2020-03-22 DIAGNOSIS — M6281 Muscle weakness (generalized): Secondary | ICD-10-CM | POA: Diagnosis not present

## 2020-03-24 DIAGNOSIS — M25472 Effusion, left ankle: Secondary | ICD-10-CM | POA: Diagnosis not present

## 2020-03-24 DIAGNOSIS — R262 Difficulty in walking, not elsewhere classified: Secondary | ICD-10-CM | POA: Diagnosis not present

## 2020-03-24 DIAGNOSIS — M6281 Muscle weakness (generalized): Secondary | ICD-10-CM | POA: Diagnosis not present

## 2020-03-24 DIAGNOSIS — M25672 Stiffness of left ankle, not elsewhere classified: Secondary | ICD-10-CM | POA: Diagnosis not present

## 2020-03-29 DIAGNOSIS — R262 Difficulty in walking, not elsewhere classified: Secondary | ICD-10-CM | POA: Diagnosis not present

## 2020-03-29 DIAGNOSIS — M6281 Muscle weakness (generalized): Secondary | ICD-10-CM | POA: Diagnosis not present

## 2020-03-29 DIAGNOSIS — M25672 Stiffness of left ankle, not elsewhere classified: Secondary | ICD-10-CM | POA: Diagnosis not present

## 2020-03-29 DIAGNOSIS — M25472 Effusion, left ankle: Secondary | ICD-10-CM | POA: Diagnosis not present

## 2020-04-01 DIAGNOSIS — M6281 Muscle weakness (generalized): Secondary | ICD-10-CM | POA: Diagnosis not present

## 2020-04-01 DIAGNOSIS — M25672 Stiffness of left ankle, not elsewhere classified: Secondary | ICD-10-CM | POA: Diagnosis not present

## 2020-04-01 DIAGNOSIS — M25472 Effusion, left ankle: Secondary | ICD-10-CM | POA: Diagnosis not present

## 2020-04-01 DIAGNOSIS — R262 Difficulty in walking, not elsewhere classified: Secondary | ICD-10-CM | POA: Diagnosis not present

## 2020-04-05 DIAGNOSIS — M6281 Muscle weakness (generalized): Secondary | ICD-10-CM | POA: Diagnosis not present

## 2020-04-05 DIAGNOSIS — R262 Difficulty in walking, not elsewhere classified: Secondary | ICD-10-CM | POA: Diagnosis not present

## 2020-04-05 DIAGNOSIS — M25472 Effusion, left ankle: Secondary | ICD-10-CM | POA: Diagnosis not present

## 2020-04-05 DIAGNOSIS — M25672 Stiffness of left ankle, not elsewhere classified: Secondary | ICD-10-CM | POA: Diagnosis not present

## 2020-04-08 DIAGNOSIS — M25672 Stiffness of left ankle, not elsewhere classified: Secondary | ICD-10-CM | POA: Diagnosis not present

## 2020-04-08 DIAGNOSIS — M6281 Muscle weakness (generalized): Secondary | ICD-10-CM | POA: Diagnosis not present

## 2020-04-08 DIAGNOSIS — R262 Difficulty in walking, not elsewhere classified: Secondary | ICD-10-CM | POA: Diagnosis not present

## 2020-04-08 DIAGNOSIS — M25472 Effusion, left ankle: Secondary | ICD-10-CM | POA: Diagnosis not present

## 2020-04-13 DIAGNOSIS — M25472 Effusion, left ankle: Secondary | ICD-10-CM | POA: Diagnosis not present

## 2020-06-22 ENCOUNTER — Ambulatory Visit (INDEPENDENT_AMBULATORY_CARE_PROVIDER_SITE_OTHER): Payer: Medicare Other

## 2020-06-22 ENCOUNTER — Other Ambulatory Visit: Payer: Self-pay | Admitting: Obstetrics and Gynecology

## 2020-06-22 ENCOUNTER — Other Ambulatory Visit: Payer: Self-pay

## 2020-06-22 DIAGNOSIS — M8588 Other specified disorders of bone density and structure, other site: Secondary | ICD-10-CM

## 2020-06-22 DIAGNOSIS — M8589 Other specified disorders of bone density and structure, multiple sites: Secondary | ICD-10-CM

## 2020-06-22 DIAGNOSIS — M8580 Other specified disorders of bone density and structure, unspecified site: Secondary | ICD-10-CM

## 2020-06-22 DIAGNOSIS — Z780 Asymptomatic menopausal state: Secondary | ICD-10-CM

## 2020-06-22 DIAGNOSIS — Z01419 Encounter for gynecological examination (general) (routine) without abnormal findings: Secondary | ICD-10-CM

## 2020-08-17 DIAGNOSIS — I10 Essential (primary) hypertension: Secondary | ICD-10-CM | POA: Diagnosis not present

## 2020-08-17 DIAGNOSIS — G4700 Insomnia, unspecified: Secondary | ICD-10-CM | POA: Diagnosis not present

## 2020-08-17 DIAGNOSIS — I700 Atherosclerosis of aorta: Secondary | ICD-10-CM | POA: Diagnosis not present

## 2020-08-17 DIAGNOSIS — K5900 Constipation, unspecified: Secondary | ICD-10-CM | POA: Diagnosis not present

## 2020-09-10 DIAGNOSIS — Z23 Encounter for immunization: Secondary | ICD-10-CM | POA: Diagnosis not present

## 2020-11-12 ENCOUNTER — Other Ambulatory Visit: Payer: Self-pay | Admitting: *Deleted

## 2020-11-12 NOTE — Telephone Encounter (Signed)
Med refill request: triamcinolone ointment 7.0% Hx of Lichen sclerosis Last AEX: 02/18/20/ JK Next AEX: 02/18/21/ ML Last MMG (if hormonal med) N/A  Refill authorized: Please Advise?  Rx pended

## 2020-11-14 MED ORDER — TRIAMCINOLONE ACETONIDE 0.1 % EX OINT: 1.0000 "application " | TOPICAL_OINTMENT | Freq: Two times a day (BID) | CUTANEOUS | 0 refills | Status: DC | PRN

## 2020-12-28 DIAGNOSIS — Z1231 Encounter for screening mammogram for malignant neoplasm of breast: Secondary | ICD-10-CM | POA: Diagnosis not present

## 2021-02-11 DIAGNOSIS — H35363 Drusen (degenerative) of macula, bilateral: Secondary | ICD-10-CM | POA: Diagnosis not present

## 2021-02-15 DIAGNOSIS — I10 Essential (primary) hypertension: Secondary | ICD-10-CM | POA: Diagnosis not present

## 2021-02-15 DIAGNOSIS — E538 Deficiency of other specified B group vitamins: Secondary | ICD-10-CM | POA: Diagnosis not present

## 2021-02-15 DIAGNOSIS — E559 Vitamin D deficiency, unspecified: Secondary | ICD-10-CM | POA: Diagnosis not present

## 2021-02-18 ENCOUNTER — Other Ambulatory Visit: Payer: Self-pay

## 2021-02-18 ENCOUNTER — Encounter: Payer: Self-pay | Admitting: Obstetrics & Gynecology

## 2021-02-18 ENCOUNTER — Ambulatory Visit (INDEPENDENT_AMBULATORY_CARE_PROVIDER_SITE_OTHER): Payer: Medicare Other | Admitting: Obstetrics & Gynecology

## 2021-02-18 VITALS — BP 110/70 | HR 78 | Resp 16 | Ht 65.25 in | Wt 160.0 lb

## 2021-02-18 DIAGNOSIS — Z01419 Encounter for gynecological examination (general) (routine) without abnormal findings: Secondary | ICD-10-CM

## 2021-02-18 DIAGNOSIS — Z780 Asymptomatic menopausal state: Secondary | ICD-10-CM

## 2021-02-18 DIAGNOSIS — M8589 Other specified disorders of bone density and structure, multiple sites: Secondary | ICD-10-CM

## 2021-02-18 DIAGNOSIS — M810 Age-related osteoporosis without current pathological fracture: Secondary | ICD-10-CM

## 2021-02-18 DIAGNOSIS — Z9189 Other specified personal risk factors, not elsewhere classified: Secondary | ICD-10-CM

## 2021-02-18 NOTE — Progress Notes (Signed)
EMLYN MAVES [birth year] [id number]   History:    76 y.o. G2P2L2  RP:  Established patient presenting for annual gyn exam   HPI: Postmenopausal. Prior TVH in 1994 for leiomyoma.  Laparoscopic BSO 2013 with bilateral serous cystadenomas.  No significant menopausal symptoms.  No pelvic pain. Pap smear surveillance discontinued with no prior history of abnormal Pap smears and a prior hysterectomy.  History of lichen sclerosis.  Biopsy-proven 2017.  Mild itching symptoms, uses petroleum jelly as needed and occasionally using triamcinolone 0.1% ointment.  Breasts normal.  Mammogram Neg in 2022.  Colonoscopy 2019. Osteopenia T-Score -2.0 Rt Fem Neck on BD 05/2020.  BMI 26.42.  Health labs with Fam MD.   Past medical history,surgical history, family history and social history were all reviewed and documented in the EPIC chart.  Gynecologic History No LMP recorded. Patient has had a hysterectomy.  Obstetric History OB History  Gravida Para Term Preterm AB Living  2 2 2     2   SAB IAB Ectopic Multiple Live Births               # Outcome Date GA Lbr Len/2nd Weight Sex Delivery Anes PTL Lv  2 Term           1 Term              ROS: A ROS was performed and pertinent positives and negatives are included in the history.  GENERAL: No fevers or chills. HEENT: No change in vision, no earache, sore throat or sinus congestion. NECK: No pain or stiffness. CARDIOVASCULAR: No chest pain or pressure. No palpitations. PULMONARY: No shortness of breath, cough or wheeze. GASTROINTESTINAL: No abdominal pain, nausea, vomiting or diarrhea, melena or bright red blood per rectum. GENITOURINARY: No urinary frequency, urgency, hesitancy or dysuria. MUSCULOSKELETAL: No joint or muscle pain, no back pain, no recent trauma. DERMATOLOGIC: No rash, no itching, no lesions. ENDOCRINE: No polyuria, polydipsia, no heat or cold intolerance. No recent change in weight. HEMATOLOGICAL: No anemia or easy bruising or bleeding.  NEUROLOGIC: No headache, seizures, numbness, tingling or weakness. PSYCHIATRIC: No depression, no loss of interest in normal activity or change in sleep pattern.     Exam:   BP 110/70    Pulse 78    Resp 16    Ht 5' 5.25" (1.657 m)    Wt 160 lb (72.6 kg)    BMI 26.42 kg/m   Body mass index is 26.42 kg/m.  General appearance : Well developed well nourished female. No acute distress HEENT: Eyes: no retinal hemorrhage or exudates,  Neck supple, trachea midline, no carotid bruits, no thyroidmegaly Lungs: Clear to auscultation, no rhonchi or wheezes, or rib retractions  Heart: Regular rate and rhythm, no murmurs or gallops Breast:Examined in sitting and supine position were symmetrical in appearance, no palpable masses or tenderness,  no skin retraction, no nipple inversion, no nipple discharge, no skin discoloration, no axillary or supraclavicular lymphadenopathy Abdomen: no palpable masses or tenderness, no rebound or guarding Extremities: no edema or skin discoloration or tenderness  Pelvic: Vulva: Normal             Vagina: No gross lesions or discharge  Cervix/Uterus absent  Adnexa  Without masses or tenderness  Anus: Normal   Assessment/Plan:  76 y.o. female for annual exam   1. Well female exam with routine gynecological exam Postmenopausal. Prior TVH in 1994 for leiomyoma.  Laparoscopic BSO 2013 with bilateral serous cystadenomas.  No significant menopausal  symptoms.  No pelvic pain. Pap smear surveillance discontinued with no prior history of abnormal Pap smears and a prior hysterectomy.  History of lichen sclerosis.  Biopsy-proven 2017.  Mild itching symptoms, uses petroleum jelly as needed and occasionally using triamcinolone 0.1% ointment.  Breasts normal.  Mammogram Neg in 2022.  Colonoscopy 2019. Osteopenia T-Score -2.0 Rt Fem Neck on BD 05/2020.  BMI 26.42.  Health labs with Fam MD.  2. At risk of fracture due to osteopenia  3. Postmenopause Postmenopausal. Prior TVH in  1994 for leiomyoma.  Laparoscopic BSO 2013 with bilateral serous cystadenomas.  No significant menopausal symptoms.  No pelvic pain.  4. Osteopenia of multiple sites  Osteopenia T-Score -2.0 Rt Fem Neck on BD 05/2020. Vit D, Ca++ total of 1.5 g/d, regular weight bearing physical activities.  Princess Bruins MD, 11:53 AM 02/18/2021

## 2021-02-22 DIAGNOSIS — E559 Vitamin D deficiency, unspecified: Secondary | ICD-10-CM | POA: Diagnosis not present

## 2021-02-22 DIAGNOSIS — Z1212 Encounter for screening for malignant neoplasm of rectum: Secondary | ICD-10-CM | POA: Diagnosis not present

## 2021-02-22 DIAGNOSIS — K5900 Constipation, unspecified: Secondary | ICD-10-CM | POA: Diagnosis not present

## 2021-02-22 DIAGNOSIS — E7800 Pure hypercholesterolemia, unspecified: Secondary | ICD-10-CM | POA: Diagnosis not present

## 2021-02-22 DIAGNOSIS — Z1339 Encounter for screening examination for other mental health and behavioral disorders: Secondary | ICD-10-CM | POA: Diagnosis not present

## 2021-02-22 DIAGNOSIS — Z1331 Encounter for screening for depression: Secondary | ICD-10-CM | POA: Diagnosis not present

## 2021-02-22 DIAGNOSIS — I700 Atherosclerosis of aorta: Secondary | ICD-10-CM | POA: Diagnosis not present

## 2021-02-22 DIAGNOSIS — R82998 Other abnormal findings in urine: Secondary | ICD-10-CM | POA: Diagnosis not present

## 2021-02-22 DIAGNOSIS — E538 Deficiency of other specified B group vitamins: Secondary | ICD-10-CM | POA: Diagnosis not present

## 2021-02-22 DIAGNOSIS — I10 Essential (primary) hypertension: Secondary | ICD-10-CM | POA: Diagnosis not present

## 2021-02-22 DIAGNOSIS — G4700 Insomnia, unspecified: Secondary | ICD-10-CM | POA: Diagnosis not present

## 2021-02-22 DIAGNOSIS — Z0000 Encounter for general adult medical examination without abnormal findings: Secondary | ICD-10-CM | POA: Diagnosis not present

## 2021-02-22 DIAGNOSIS — M542 Cervicalgia: Secondary | ICD-10-CM | POA: Diagnosis not present

## 2022-01-03 DIAGNOSIS — Z1231 Encounter for screening mammogram for malignant neoplasm of breast: Secondary | ICD-10-CM | POA: Diagnosis not present

## 2022-04-01 LAB — AMB RESULTS CONSOLE CBG: Glucose: 108

## 2022-04-01 NOTE — Progress Notes (Signed)
Pt has PCP. Pt is not fasting. Pt going to see PCP this week and will follow up on BP.

## 2022-04-03 DIAGNOSIS — E538 Deficiency of other specified B group vitamins: Secondary | ICD-10-CM | POA: Diagnosis not present

## 2022-04-03 DIAGNOSIS — I10 Essential (primary) hypertension: Secondary | ICD-10-CM | POA: Diagnosis not present

## 2022-04-03 DIAGNOSIS — R7989 Other specified abnormal findings of blood chemistry: Secondary | ICD-10-CM | POA: Diagnosis not present

## 2022-04-03 DIAGNOSIS — E559 Vitamin D deficiency, unspecified: Secondary | ICD-10-CM | POA: Diagnosis not present

## 2022-04-03 DIAGNOSIS — E7800 Pure hypercholesterolemia, unspecified: Secondary | ICD-10-CM | POA: Diagnosis not present

## 2022-04-03 DIAGNOSIS — Z1212 Encounter for screening for malignant neoplasm of rectum: Secondary | ICD-10-CM | POA: Diagnosis not present

## 2022-04-06 DIAGNOSIS — Z1212 Encounter for screening for malignant neoplasm of rectum: Secondary | ICD-10-CM | POA: Diagnosis not present

## 2022-04-06 DIAGNOSIS — E538 Deficiency of other specified B group vitamins: Secondary | ICD-10-CM | POA: Diagnosis not present

## 2022-04-07 ENCOUNTER — Encounter: Payer: Self-pay | Admitting: *Deleted

## 2022-04-07 NOTE — Progress Notes (Signed)
Pt seen at 04/01/22 screening event with elevated b/p of 158/112. Pt indicated not SDOH needs and stated at event she would f/u her b/p with her PCP, which she did on 04/03/22. No additional health equity team support indicated at this time.

## 2022-04-10 DIAGNOSIS — E559 Vitamin D deficiency, unspecified: Secondary | ICD-10-CM | POA: Diagnosis not present

## 2022-04-10 DIAGNOSIS — Z1331 Encounter for screening for depression: Secondary | ICD-10-CM | POA: Diagnosis not present

## 2022-04-10 DIAGNOSIS — K5900 Constipation, unspecified: Secondary | ICD-10-CM | POA: Diagnosis not present

## 2022-04-10 DIAGNOSIS — I700 Atherosclerosis of aorta: Secondary | ICD-10-CM | POA: Diagnosis not present

## 2022-04-10 DIAGNOSIS — I10 Essential (primary) hypertension: Secondary | ICD-10-CM | POA: Diagnosis not present

## 2022-04-10 DIAGNOSIS — G4700 Insomnia, unspecified: Secondary | ICD-10-CM | POA: Diagnosis not present

## 2022-04-10 DIAGNOSIS — Z1339 Encounter for screening examination for other mental health and behavioral disorders: Secondary | ICD-10-CM | POA: Diagnosis not present

## 2022-04-10 DIAGNOSIS — Z0000 Encounter for general adult medical examination without abnormal findings: Secondary | ICD-10-CM | POA: Diagnosis not present

## 2022-04-10 DIAGNOSIS — E7800 Pure hypercholesterolemia, unspecified: Secondary | ICD-10-CM | POA: Diagnosis not present

## 2022-06-23 DIAGNOSIS — H35363 Drusen (degenerative) of macula, bilateral: Secondary | ICD-10-CM | POA: Diagnosis not present

## 2022-06-28 DIAGNOSIS — I10 Essential (primary) hypertension: Secondary | ICD-10-CM | POA: Diagnosis not present

## 2022-06-28 DIAGNOSIS — E7800 Pure hypercholesterolemia, unspecified: Secondary | ICD-10-CM | POA: Diagnosis not present

## 2022-08-10 ENCOUNTER — Encounter: Payer: Self-pay | Admitting: *Deleted

## 2022-08-14 ENCOUNTER — Institutional Professional Consult (permissible substitution): Payer: Medicare Other | Admitting: Neurology

## 2022-08-14 ENCOUNTER — Encounter: Payer: Self-pay | Admitting: Neurology

## 2022-08-29 ENCOUNTER — Telehealth: Payer: Self-pay

## 2022-08-29 DIAGNOSIS — I10 Essential (primary) hypertension: Secondary | ICD-10-CM | POA: Diagnosis not present

## 2022-08-29 DIAGNOSIS — R1013 Epigastric pain: Secondary | ICD-10-CM | POA: Diagnosis not present

## 2022-08-29 DIAGNOSIS — G4700 Insomnia, unspecified: Secondary | ICD-10-CM | POA: Diagnosis not present

## 2022-08-29 DIAGNOSIS — I700 Atherosclerosis of aorta: Secondary | ICD-10-CM | POA: Diagnosis not present

## 2022-08-29 NOTE — Telephone Encounter (Signed)
RN called patient for pre-visit prior to colonoscopy which is scheduled for 09/14/22. Patient was in ED with husband, so RN was unable to complete appointment. RN rescheduled patient pre-visit for 8/13 at 1:30. Patient stated that this would be a good date and time for her.

## 2022-08-30 ENCOUNTER — Other Ambulatory Visit (HOSPITAL_COMMUNITY): Payer: Self-pay | Admitting: Internal Medicine

## 2022-08-30 DIAGNOSIS — R1013 Epigastric pain: Secondary | ICD-10-CM

## 2022-08-30 DIAGNOSIS — R140 Abdominal distension (gaseous): Secondary | ICD-10-CM | POA: Diagnosis not present

## 2022-09-05 ENCOUNTER — Ambulatory Visit (AMBULATORY_SURGERY_CENTER): Payer: Medicare Other

## 2022-09-05 VITALS — Ht 67.0 in | Wt 162.0 lb

## 2022-09-05 DIAGNOSIS — Z86010 Personal history of colonic polyps: Secondary | ICD-10-CM

## 2022-09-05 MED ORDER — NA SULFATE-K SULFATE-MG SULF 17.5-3.13-1.6 GM/177ML PO SOLN: 1.0000 | Freq: Once | ORAL | 0 refills | Status: AC

## 2022-09-05 NOTE — Progress Notes (Signed)
No egg or soy allergy known to patient  No issues known to pt with past sedation with any surgeries or procedures Patient denies ever being told they had issues or difficulty with intubation  No FH of Malignant Hyperthermia Pt is not on diet pills Pt is not on  home 02  Pt is not on blood thinners  Pt denies issues with constipation takes Benefiber No A fib or A flutter Have any cardiac testing pending--no Pt can ambulate independently Pt denies use of chewing tobacco Discussed diabetic I weight loss medication holds Discussed NSAID holds Checked BMI Pt instructed to use Single[employer]com or GoodRx for a price reduction on prep  Patient's chart reviewed by Cathlyn Parsons CNRA prior to previsit and patient appropriate for the [facility].  Pre visit completed and red dot placed by patient's name on their procedure day (on provider's schedule).

## 2022-09-14 ENCOUNTER — Ambulatory Visit (AMBULATORY_SURGERY_CENTER): Payer: Medicare Other | Admitting: Internal Medicine

## 2022-09-14 ENCOUNTER — Encounter: Payer: Self-pay | Admitting: Internal Medicine

## 2022-09-14 VITALS — BP 142/64 | HR 64 | Temp 96.6°F | Resp 11 | Ht 67.0 in | Wt 162.0 lb

## 2022-09-14 DIAGNOSIS — Z09 Encounter for follow-up examination after completed treatment for conditions other than malignant neoplasm: Secondary | ICD-10-CM

## 2022-09-14 DIAGNOSIS — I10 Essential (primary) hypertension: Secondary | ICD-10-CM | POA: Diagnosis not present

## 2022-09-14 DIAGNOSIS — Z86010 Personal history of colonic polyps: Secondary | ICD-10-CM | POA: Diagnosis not present

## 2022-09-14 DIAGNOSIS — Z800 Family history of malignant neoplasm of digestive organs: Secondary | ICD-10-CM | POA: Diagnosis not present

## 2022-09-14 MED ORDER — SODIUM CHLORIDE 0.9 % IV SOLN: 500.0000 mL | INTRAVENOUS | Status: DC

## 2022-09-14 NOTE — Progress Notes (Signed)
Patient states there have been no changes to medical or surgical history since time of pre-visit. 
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
## 2022-09-14 NOTE — Patient Instructions (Addendum)
No polyps today.  No more routine repeat colonoscopy.  I appreciate the opportunity to care for you. Iva Boop, MD, [facility]  Handouts provided on diverticulosis.  Resume previous diet.  Continue present medications.  No repeat colonoscopy due to age.   YOU HAD AN ENDOSCOPIC PROCEDURE TODAY AT THE [facility]:   Refer to the procedure report that was given to you for any specific questions about what was found during the examination.  If the procedure report does not answer your questions, please call your gastroenterologist to clarify.  If you requested that your care partner not be given the details of your procedure findings, then the procedure report has been included in a sealed envelope for you to review at your convenience later.  YOU SHOULD EXPECT: Some feelings of bloating in the abdomen. Passage of more gas than usual.  Walking can help get rid of the air that was put into your GI tract during the procedure and reduce the bloating. If you had a lower endoscopy (such as a colonoscopy or flexible sigmoidoscopy) you may notice spotting of blood in your stool or on the toilet paper. If you underwent a bowel prep for your procedure, you may not have a normal bowel movement for a few days.  Please Note:  You might notice some irritation and congestion in your nose or some drainage.  This is from the oxygen used during your procedure.  There is no need for concern and it should clear up in a day or so.  SYMPTOMS TO REPORT IMMEDIATELY:  Following lower endoscopy (colonoscopy or flexible sigmoidoscopy):  Excessive amounts of blood in the stool  Significant tenderness or worsening of abdominal pains  Swelling of the abdomen that is new, acute  Fever of 100F or higher  For urgent or emergent issues, a gastroenterologist can be reached at any hour by calling [phone]. Do not use MyChart messaging for urgent concerns.    DIET:  We do recommend a small meal at first,  but then you may proceed to your regular diet.  Drink plenty of fluids but you should avoid alcoholic beverages for 24 hours.  ACTIVITY:  You should plan to take it easy for the rest of today and you should NOT DRIVE or use heavy machinery until tomorrow (because of the sedation medicines used during the test).    FOLLOW UP: Our staff will call the number listed on your records the next business day following your procedure.  We will call around 7:15- 8:00 am to check on you and address any questions or concerns that you may have regarding the information given to you following your procedure. If we do not reach you, we will leave a message.     If any biopsies were taken you will be contacted by phone or by letter within the next 1-3 weeks.  Please call us at [phone] if you have not heard about the biopsies in 3 weeks.    SIGNATURES/CONFIDENTIALITY: You and/or your care partner have signed paperwork which will be entered into your electronic medical record.  These signatures attest to the fact that that the information above on your After Visit Summary has been reviewed and is understood.  Full responsibility of the confidentiality of this discharge information lies with you and/or your care-partner.

## 2022-09-14 NOTE — Progress Notes (Signed)
Vss nad trans to pacu 

## 2022-09-14 NOTE — Op Note (Signed)
[facility] Patient Name: Nancy Beck Procedure Date: 09/14/2022 10:51 AM MRN: [id number] Endoscopist: Iva Boop , MD, [id number] Age: 77 Referring MD:  Date of Birth: 19-Sep-1945 Gender: Female Account #: [id number] Procedure:                Colonoscopy Indications:              Surveillance: Personal history of adenomatous                            polyps on last colonoscopy 5 years ago Medicines:                Monitored Anesthesia Care Procedure:                Pre-Anesthesia Assessment:                           - Prior to the procedure, a History and Physical                            was performed, and patient medications and                            allergies were reviewed. The patient's tolerance of                            previous anesthesia was also reviewed. The risks                            and benefits of the procedure and the sedation                            options and risks were discussed with the patient.                            All questions were answered, and informed consent                            was obtained. Prior Anticoagulants: The patient has                            taken no anticoagulant or antiplatelet agents. ASA                            Grade Assessment: II - A patient with mild systemic                            disease. After reviewing the risks and benefits,                            the patient was deemed in satisfactory condition to                            undergo the procedure.  After obtaining informed consent, the colonoscope                            was passed under direct vision. Throughout the                            procedure, the patient's blood pressure, pulse, and                            oxygen saturations were monitored continuously. The                            Olympus Scope SN: [id number] was introduced through                            the anus and  advanced to the the cecum, identified                            by appendiceal orifice and ileocecal valve. The                            colonoscopy was performed without difficulty. The                            patient tolerated the procedure well. The quality                            of the bowel preparation was good. The ileocecal                            valve, appendiceal orifice, and rectum were                            photographed. Scope In: 11:10:44 AM Scope Out: 11:20:41 AM Scope Withdrawal Time: 0 hours 6 minutes 21 seconds  Total Procedure Duration: 0 hours 9 minutes 57 seconds  Findings:                 The perianal and digital rectal examinations were                            normal.                           A few diverticula were found in the sigmoid colon.                           The exam was otherwise without abnormality on                            direct and retroflexion views. Complications:            No immediate complications. Estimated Blood Loss:     Estimated blood loss: none. Impression:               - Diverticulosis in the sigmoid  colon.                           - The examination was otherwise normal on direct                            and retroflexion views except for hemorrhoids as                            seen in past.                           - No specimens collected. Recommendation:           - Patient has a contact number available for                            emergencies. The signs and symptoms of potential                            delayed complications were discussed with the                            patient. Return to normal activities tomorrow.                            Written discharge instructions were provided to the                            patient.                           - Resume previous diet.                           - Continue present medications.                           - No repeat colonoscopy due to  age. Iva Boop, MD 09/14/2022 11:26:52 AM This report has been signed electronically.

## 2022-09-15 ENCOUNTER — Telehealth: Payer: Self-pay

## 2022-09-15 NOTE — Telephone Encounter (Signed)
  Follow up Call-     09/14/2022   10:12 AM  Call back number  Post procedure Call Back phone  # [phone]  Permission to leave phone message Yes     Patient questions:  Do you have a fever, pain , or abdominal swelling? No. Pain Score  0 *  Have you tolerated food without any problems? Yes.    Have you been able to return to your normal activities? Yes.    Do you have any questions about your discharge instructions: Diet   No. Medications  No. Follow up visit  No.  Do you have questions or concerns about your Care? No.  Actions: * If pain score is 4 or above: No action needed, pain <4.

## 2022-09-18 ENCOUNTER — Encounter: Payer: Self-pay | Admitting: Neurology

## 2022-09-18 ENCOUNTER — Ambulatory Visit: Payer: Medicare Other | Admitting: Neurology

## 2022-09-18 VITALS — BP 152/81 | HR 67 | Ht 66.5 in | Wt 159.0 lb

## 2022-09-18 DIAGNOSIS — G4700 Insomnia, unspecified: Secondary | ICD-10-CM

## 2022-09-18 DIAGNOSIS — R030 Elevated blood-pressure reading, without diagnosis of hypertension: Secondary | ICD-10-CM

## 2022-09-18 DIAGNOSIS — R351 Nocturia: Secondary | ICD-10-CM

## 2022-09-18 DIAGNOSIS — R5383 Other fatigue: Secondary | ICD-10-CM

## 2022-09-18 DIAGNOSIS — G479 Sleep disorder, unspecified: Secondary | ICD-10-CM

## 2022-09-18 DIAGNOSIS — R0683 Snoring: Secondary | ICD-10-CM
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
## 2022-09-18 NOTE — Patient Instructions (Signed)

## 2022-09-20 ENCOUNTER — Ambulatory Visit (HOSPITAL_COMMUNITY)
Admission: RE | Admit: 2022-09-20 | Discharge: 2022-09-20 | Disposition: A | Discharge status: Home / Self Care | Payer: Medicare Other | Source: Ambulatory Visit | Attending: Internal Medicine | Admitting: Internal Medicine

## 2022-09-20 DIAGNOSIS — R140 Abdominal distension (gaseous): Secondary | ICD-10-CM | POA: Diagnosis not present

## 2022-09-20 DIAGNOSIS — R1013 Epigastric pain: Secondary | ICD-10-CM | POA: Insufficient documentation

## 2022-09-21 ENCOUNTER — Telehealth: Payer: Self-pay | Admitting: Neurology

## 2022-09-21 NOTE — Telephone Encounter (Signed)
[employer]/BCBS fed no auth req due to Medicare primary ref # Ciara H on 09/20/22   Sent mychart message.

## 2022-10-19 NOTE — Telephone Encounter (Signed)
I called the patient to schedule her SS, she did not pick up I left her a voicemail informing her I sent her a mychart message and I also left my direct number to call back.

## 2022-12-05 ENCOUNTER — Ambulatory Visit (INDEPENDENT_AMBULATORY_CARE_PROVIDER_SITE_OTHER): Payer: Medicare Other | Admitting: Neurology

## 2022-12-05 DIAGNOSIS — G479 Sleep disorder, unspecified: Secondary | ICD-10-CM

## 2022-12-05 DIAGNOSIS — R030 Elevated blood-pressure reading, without diagnosis of hypertension: Secondary | ICD-10-CM

## 2022-12-05 DIAGNOSIS — G4733 Obstructive sleep apnea (adult) (pediatric): Secondary | ICD-10-CM | POA: Diagnosis not present

## 2022-12-05 DIAGNOSIS — G4720 Circadian rhythm sleep disorder, unspecified type: Secondary | ICD-10-CM

## 2022-12-05 DIAGNOSIS — R5383 Other fatigue: Secondary | ICD-10-CM

## 2022-12-05 DIAGNOSIS — R0683 Snoring: Secondary | ICD-10-CM

## 2022-12-05 DIAGNOSIS — G4700 Insomnia, unspecified: Secondary | ICD-10-CM

## 2022-12-05 DIAGNOSIS — R351 Nocturia: Secondary | ICD-10-CM

## 2022-12-07 NOTE — Procedures (Signed)
Physician Interpretation:     [facility] POLYSOMNOGRAPHY  INTERPRETATION REPORT   STUDY DATE:  12/05/2022     PATIENT NAME:  Nancy Beck         DATE OF BIRTH:  1945/07/14  PATIENT ID:  [id number]    TYPE OF STUDY:  PSG  READING PHYSICIAN: Huston Foley, MD, PhD   SCORING TECHNICIAN: Margaretann Loveless, RPSGT  Referred by: Garlan Fillers, MD  ? History and Indication for Testing: 77 year old female with an underlying medical history of hypertension, osteopenia, hyperlipidemia, B12 deficiency, syncope, and borderline overweight state, who reports snoring and excessive daytime somnolence as well as sleep disruption and difficulty going to sleep. Symptoms have been ongoing for the past few years. Her Epworth sleepiness score is 4/24, fatigue severity score is 19 out of 63. Height: 66 in Weight: 159 lb (BMI 26) Neck Size: 13 in    MEDICATIONS: Norvasc, Caltrate, Coreg, Multivitamin, Kenalog   TECHNICAL DESCRIPTION: A registered sleep technologist was in attendance for the duration of the recording.  Data collection, scoring, video monitoring, and reporting were performed in compliance with the AASM Manual for the Scoring of Sleep and Associated Events; (Hypopnea is scored based on the criteria listed in Section VIII D. 1b in the AASM Manual V2.6 using a 4% oxygen desaturation rule or Hypopnea is scored based on the criteria listed in Section VIII D. 1a in the AASM Manual V2.6 using 3% oxygen desaturation and /or arousal rule).   SLEEP CONTINUITY AND SLEEP ARCHITECTURE:  Lights-out was at 21:24: and lights-on at  05:01:, with a total recording time of 7 hours and 36 min. Total sleep time ( TST) was 421.5 minutes with a normal sleep efficiency at 92.4%. There was  13.3% REM sleep.   BODY POSITION:  TST was divided  between the following sleep positions: 31.9% supine;  68.1% lateral;  0% prone. Duration of total sleep and percent of total sleep in their  respective position is as follows: supine 134 minutes (32%), non-supine 287 minutes (68%); right 87 minutes (21%), left 200 minutes (47%), and prone 00 minutes (0%).  Total supine REM sleep time was 45 minutes (81% of total REM sleep).  Sleep latency was normal at 13.5 minutes.  REM sleep latency was markedly increased at 269.0 minutes. Of the total sleep time, the percentage of stage N1 sleep was 1.8%, stage N2 sleep was 72%, which is increased, stage N3 sleep was 13.4%, and REM sleep was 13.3%, which is reduced. Wake after sleep onset (WASO) time accounted for 21 minutes with minimal sleep fragmentation noted.   RESPIRATORY MONITORING:  Based on CMS criteria (using a 4% oxygen desaturation rule for scoring hypopneas), there were 7 apneas (7 obstructive; 0 central; 0 mixed), and 32 hypopneas.  Apnea index was 1.0. Hypopnea index was 4.6. The apnea-hypopnea index was 5.6/hour overall (13.4 supine, 11 non-supine; 26.8 REM, 30.3 supine REM).  There were 0 respiratory effort-related arousals (RERAs).  The RERA index was 0 events/h. Total respiratory disturbance index (RDI) was 5.6 events/h. RDI results showed: supine RDI  13.4 /h; non-supine RDI 1.9 /h; REM RDI 26.8 /h, supine REM RDI 30.3 /h.   Based on AASM criteria (using a 3% oxygen desaturation and /or arousal rule for scoring hypopneas), there were 7 apneas (7 obstructive; 0 central; 0 mixed), and 32 hypopneas. Apnea index was 1.0. Hypopnea index was 4.6. The apnea-hypopnea index was 5.6 overall (13.4 supine, 11 non-supine; 26.8 REM, 30.3 supine REM).  There were 0 respiratory effort-related arousals (RERAs).  The RERA index was 0 events/h. Total respiratory disturbance index (RDI) was 5.6 events/h. RDI results showed: supine RDI  13.4 /h; non-supine RDI 1.9 /h; REM RDI 26.8 /h, supine REM RDI 30.3 /h.    OXIMETRY: Oxyhemoglobin Saturation Nadir during sleep was at  84% from a mean of 96%.  Of the Total sleep time (TST)   hypoxemia (=<88%) was present  for  1.6 minutes, or 0.4% of total sleep time.    LIMB MOVEMENTS: There were 0 periodic limb movements of sleep (0.0/hr), of which 0 (0.0/hr) were associated with an arousal.   AROUSAL: There were 49 arousals in total, for an arousal index of 7 arousals/hour.  Of these, 8 were identified as respiratory-related arousals (1 /h), 0 were PLM-related arousals (0 /h), and 53 were non-specific arousals (8 /h).   EEG: Review of the EEG showed no abnormal electrical discharges and symmetrical bihemispheric findings.    EKG: The EKG revealed normal sinus rhythm (NSR). The average heart rate during sleep was 61 bpm.   AUDIO/VIDEO REVIEW: The audio and video review did not show any abnormal or unusual behaviors, movements, phonations or vocalizations. The patient took no restroom breaks. Snoring was noted, ranging from mild to louder.   POST-STUDY QUESTIONNAIRE: Post study, the patient indicated, that sleep was not fully completed.    IMPRESSION:   1. Mild Obstructive Sleep Apnea (OSA) 2. Dysfunctions associated with sleep stages or arousal from sleep  RECOMMENDATIONS:  1. This study demonstrates overall mild obstructive sleep apnea, more pronounced during REM sleep and supine sleep with a total AHI borderline at 5.6/h, supine AHI of 13.4/h, REM AHI of 26.8/h, O2 nadir 84% (during supine REM sleep).  Treatment options include treatment with positive airway pressure device such as AutoPap therapy at home, avoidance of supine sleep position along with some degree of weight loss, or the use of an oral appliance in (suitable patients).  These treatment avenues will be discussed with the patient.  2. This study shows little sleep fragmentation and abnormal sleep stage percentages; these are nonspecific findings and per se do not signify an intrinsic sleep disorder or a cause for the patient's sleep-related symptoms. Causes include (but are not limited to) the first night effect of the sleep study, circadian  rhythm disturbances, medication effect or an underlying mood disorder or medical problem.  3. The patient should be cautioned not to drive, work at heights, or operate dangerous or heavy equipment when tired or sleepy. Review and reiteration of good sleep hygiene measures should be pursued with any patient. 4. The patient will be seen in follow-up by Dr. Frances Furbish at [facility] for discussion of the test results and further management strategies. The referring provider will be notified of the test results.   I certify that I have reviewed the entire raw data recording prior to the issuance of this report in accordance with the Standards of Accreditation of the American Academy of Sleep Medicine (AASM).  Huston Foley, MD, PhD Medical Director, [facility]) Diplomat, ABPN (Neurology and Sleep)               Technical Report:   General Information  Name: Nancy Beck, Nancy Beck BMI: 26.06 Physician: Huston Foley, MD  ID: [id number] Height: 65.5 in Technician: Margaretann Loveless, RPSGT  Sex: Female Weight: 159.0 lb Record: [id number]  Age: 64 [1945/05/13] Date: 12/05/2022    Medical & Medication History    77 year old female  with an underlying medical history of hypertension, osteopenia, hyperlipidemia, B12 deficiency, syncope, and borderline overweight state, who reports snoring and excessive daytime somnolence as well as sleep disruption and difficulty going to sleep. Symptoms have been ongoing for the past few years. Norvasc, Caltrate, Coreg, Multivitamin, Kenalog   Sleep Disorder      Comments   The patient came into the sleep lab for a PSG. No restroom breaks. EKG did not show any obvious cardiac arrhythmias. Mild to moderate snoring. Snoring was louder when the patient was supine. Respiratory events scored with a 4% desat. Majority of respiratory events while in REM supine. The patient slept supine and lateral. All sleep stages witnessed. AHI was 3.0 after 2  hrs of TST.     Lights out: 09:24:49 PM Lights on: 05:01:04 AM   Time Total Supine Side Prone Upright  Recording (TRT) 7h 36.71m 2h 34.56m 5h 2.1m 0h 0.33m 0h 0.6m  Sleep (TST) 7h 1.22m 2h 14.69m 4h 47.62m 0h 0.91m 0h 0.70m   Latency N1 N2 N3 REM Onset Per. Slp. Eff.  Actual 0h 0.15m 0h 1.90m 0h 23.89m 4h 29.20m 0h 13.77m 0h 13.46m 92.43%   Stg Dur Wake N1 N2 N3 REM  Total 34.5 7.5 301.5 56.5 56.0  Supine 19.5 2.5 85.0 1.5 45.5  Side 15.0 5.0 216.5 55.0 10.5  Prone 0.0 0.0 0.0 0.0 0.0  Upright 0.0 0.0 0.0 0.0 0.0   Stg % Wake N1 N2 N3 REM  Total 7.6 1.8 71.5 13.4 13.3  Supine 4.3 0.6 20.2 0.4 10.8  Side 3.3 1.2 51.4 13.0 2.5  Prone 0.0 0.0 0.0 0.0 0.0  Upright 0.0 0.0 0.0 0.0 0.0     Apnea Summary Sub Supine Side Prone Upright  Total 7 Total 7 7 0 0 0    REM 5 5 0 0 0    NREM 2 2 0 0 0  Obs 7 REM 5 5 0 0 0    NREM 2 2 0 0 0  Mix 0 REM 0 0 0 0 0    NREM 0 0 0 0 0  Cen 0 REM 0 0 0 0 0    NREM 0 0 0 0 0   Rera Summary Sub Supine Side Prone Upright  Total 0 Total 0 0 0 0 0    REM 0 0 0 0 0    NREM 0 0 0 0 0   Hypopnea Summary Sub Supine Side Prone Upright  Total 32 Total 32 23 9 0 0    REM 20 18 2  0 0    NREM 12 5 7  0 0   4% Hypopnea Summary Sub Supine Side Prone Upright  Total (4%) 32 Total 32 23 9 0 0    REM 20 18 2  0 0    NREM 12 5 7  0 0     AHI Total Obs Mix Cen  5.55 Apnea 1.00 1.00 0.00 0.00   Hypopnea 4.56 -- -- --  5.55 Hypopnea (4%) 4.56 -- -- --    Total Supine Side Prone Upright  Position AHI 5.55 13.38 1.88 0.00 0.00  REM AHI 26.79   NREM AHI 2.30   Position RDI 5.55 13.38 1.88 0.00 0.00  REM RDI 26.79   NREM RDI 2.30    4% Hypopnea Total Supine Side Prone Upright  Position AHI (4%) 5.55 13.38 1.88 0.00 0.00  REM AHI (4%) 26.79   NREM AHI (4%) 2.30   Position RDI (4%) 5.55 13.38 1.88  0.00 0.00  REM RDI (4%) 26.79   NREM RDI (4%) 2.30    Desaturation Information Threshold: 2% <100% <90% <80% <70% <60% <50% <40%  Supine 79.0 5.0 0.0 0.0 0.0 0.0 0.0   Side 71.0 2.0 0.0 0.0 0.0 0.0 0.0  Prone 0.0 0.0 0.0 0.0 0.0 0.0 0.0  Upright 0.0 0.0 0.0 0.0 0.0 0.0 0.0  Total 150.0 7.0 0.0 0.0 0.0 0.0 0.0  Index 20.3 0.9 0.0 0.0 0.0 0.0 0.0   Threshold: 3% <100% <90% <80% <70% <60% <50% <40%  Supine 44.0 5.0 0.0 0.0 0.0 0.0 0.0  Side 26.0 2.0 0.0 0.0 0.0 0.0 0.0  Prone 0.0 0.0 0.0 0.0 0.0 0.0 0.0  Upright 0.0 0.0 0.0 0.0 0.0 0.0 0.0  Total 70.0 7.0 0.0 0.0 0.0 0.0 0.0  Index 9.5 0.9 0.0 0.0 0.0 0.0 0.0   Threshold: 4% <100% <90% <80% <70% <60% <50% <40%  Supine 28.0 5.0 0.0 0.0 0.0 0.0 0.0  Side 9.0 2.0 0.0 0.0 0.0 0.0 0.0  Prone 0.0 0.0 0.0 0.0 0.0 0.0 0.0  Upright 0.0 0.0 0.0 0.0 0.0 0.0 0.0  Total 37.0 7.0 0.0 0.0 0.0 0.0 0.0  Index 5.0 0.9 0.0 0.0 0.0 0.0 0.0   Threshold: 3% <100% <90% <80% <70% <60% <50% <40%  Supine 44 5 0 0 0 0 0  Side 26 2 0 0 0 0 0  Prone 0 0 0 0 0 0 0  Upright 0 0 0 0 0 0 0  Total 70 7 0 0 0 0 0   Awakening/Arousal Information # of Awakenings 20  Wake after sleep onset 21.51m  Wake after persistent sleep 21.66m   Arousal Assoc. Arousals Index  Apneas 2 0.3  Hypopneas 6 0.9  Leg Movements 0 0.0  Snore 0 0.0  PTT Arousals 0 0.0  Spontaneous 53 7.5  Total 61 8.7  Leg Movement Information PLMS LMs Index  Total LMs during PLMS 0 0.0  LMs w/ Microarousals 0 0.0   LM LMs Index  w/ Microarousal 0 0.0  w/ Awakening 0 0.0  w/ Resp Event 0 0.0  Spontaneous 2 0.3  Total 2 0.3     Desaturation threshold setting: 3% Minimum desaturation setting: 10 seconds SaO2 nadir: 84% The longest event was a 62 sec obstructive Hypopnea with a minimum SaO2 of 92%. The lowest SaO2 was 84% associated with a 44 sec obstructive Hypopnea. EKG Rates EKG Avg Max Min  Awake 68 93 54  Asleep 61 87 47  EKG Events: N/A

## 2022-12-07 NOTE — Progress Notes (Deleted)
Follow up.

## 2022-12-12 NOTE — Addendum Note (Signed)
Addended by: Huston Foley on: 12/12/2022 03:17 PM   Modules accepted: Orders

## 2023-01-02 DIAGNOSIS — H40013 Open angle with borderline findings, low risk, bilateral: Secondary | ICD-10-CM | POA: Diagnosis not present

## 2023-01-09 DIAGNOSIS — Z1231 Encounter for screening mammogram for malignant neoplasm of breast: Secondary | ICD-10-CM | POA: Diagnosis not present

## 2023-01-10 NOTE — Telephone Encounter (Signed)
I took the call and spoke to pt.  She had a lot of questions about machine, size, noise, etc.  I started over and relayed results of sleep study overall mild.  Recommended to use autopap, may make her feel better. Send to DME [facility]) to verify with insurance and depending on coverage she may consider.  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  I reassured if makes her feel better, more rested then would be a benefit for her caring for her husband.  I told her if she did  decide on machine then call then she received will see her back 2-3 months.  (Using 4 hour or more every night). Pt verbalized understanding of plan.

## 2023-01-11 NOTE — Telephone Encounter (Signed)
Received fax to [employer] about new autopap user.

## 2023-02-06 DIAGNOSIS — M25472 Effusion, left ankle: Secondary | ICD-10-CM | POA: Diagnosis not present

## 2023-02-14 ENCOUNTER — Telehealth: Payer: Self-pay

## 2023-02-14 NOTE — Telephone Encounter (Signed)
Pt lvm on sleep lab phone requesting a follow up visit with Dr. Frances Furbish

## 2023-02-14 NOTE — Telephone Encounter (Signed)
I see pt has been setup on cpap. She needs a follow-up visit between 03/11/23 and 05/09/23. Please schedule initial follow-up appointment with any NP or Dr Frances Furbish between those dates.

## 2023-02-15 ENCOUNTER — Telehealth: Payer: Self-pay | Admitting: Neurology

## 2023-02-15 NOTE — Telephone Encounter (Signed)
thanks

## 2023-02-15 NOTE — Telephone Encounter (Signed)
Pt has scheduled her initial CPAP f/u, pt aware t bring CPAP and power cord

## 2023-04-19 DIAGNOSIS — E7800 Pure hypercholesterolemia, unspecified: Secondary | ICD-10-CM | POA: Diagnosis not present

## 2023-04-19 DIAGNOSIS — E559 Vitamin D deficiency, unspecified: Secondary | ICD-10-CM | POA: Diagnosis not present

## 2023-04-19 DIAGNOSIS — I10 Essential (primary) hypertension: Secondary | ICD-10-CM | POA: Diagnosis not present

## 2023-04-19 DIAGNOSIS — Z1212 Encounter for screening for malignant neoplasm of rectum: Secondary | ICD-10-CM | POA: Diagnosis not present

## 2023-04-19 DIAGNOSIS — E538 Deficiency of other specified B group vitamins: Secondary | ICD-10-CM | POA: Diagnosis not present
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
## 2023-04-26 DIAGNOSIS — Z1389 Encounter for screening for other disorder: Secondary | ICD-10-CM | POA: Diagnosis not present

## 2023-04-26 DIAGNOSIS — R82998 Other abnormal findings in urine: Secondary | ICD-10-CM | POA: Diagnosis not present

## 2023-04-26 DIAGNOSIS — K581 Irritable bowel syndrome with constipation: Secondary | ICD-10-CM | POA: Diagnosis not present

## 2023-04-26 DIAGNOSIS — I10 Essential (primary) hypertension: Secondary | ICD-10-CM | POA: Diagnosis not present

## 2023-04-26 DIAGNOSIS — Z23 Encounter for immunization: Secondary | ICD-10-CM | POA: Diagnosis not present

## 2023-04-26 DIAGNOSIS — E559 Vitamin D deficiency, unspecified: Secondary | ICD-10-CM | POA: Diagnosis not present

## 2023-04-26 DIAGNOSIS — Z0000 Encounter for general adult medical examination without abnormal findings: Secondary | ICD-10-CM | POA: Diagnosis not present

## 2023-04-26 DIAGNOSIS — G4733 Obstructive sleep apnea (adult) (pediatric): Secondary | ICD-10-CM | POA: Diagnosis not present

## 2023-04-26 DIAGNOSIS — E7800 Pure hypercholesterolemia, unspecified: Secondary | ICD-10-CM | POA: Diagnosis not present

## 2023-04-30 ENCOUNTER — Encounter: Payer: Self-pay | Admitting: Adult Health

## 2023-04-30 ENCOUNTER — Ambulatory Visit (INDEPENDENT_AMBULATORY_CARE_PROVIDER_SITE_OTHER): Payer: Medicare Other | Admitting: Adult Health

## 2023-04-30 VITALS — BP 157/86 | HR 67 | Ht 67.0 in | Wt 155.0 lb

## 2023-04-30 DIAGNOSIS — Z789 Other specified health status: Secondary | ICD-10-CM | POA: Diagnosis not present

## 2023-04-30 DIAGNOSIS — G4733 Obstructive sleep apnea (adult) (pediatric): Secondary | ICD-10-CM

## 2023-04-30 NOTE — Patient Instructions (Addendum)
 Your Plan:  Ensure nightly use of CPAP for adequate sleep apnea management  Will request change of mask type in hopes of improving tolerance, if you continue to struggle with use please let me know and I will place a referral to a dentist to look at oral appliances   Continue to follow with your DME company for any needed supplies or CPAP related concerns      Follow up in 6 months with Dr. Frances Furbish or call earlier if needed     Thank you for coming to see [place] at [facility]. I hope we have been able to provide you high quality care today.  You may receive a patient satisfaction survey over the next few weeks. We would appreciate your feedback and comments so that we may continue to improve ourselves and the health of our patients.

## 2023-04-30 NOTE — Progress Notes (Signed)
 Orders sent to [employer], confirmation received.

## 2023-05-01 DIAGNOSIS — H35033 Hypertensive retinopathy, bilateral: Secondary | ICD-10-CM | POA: Diagnosis not present

## 2023-11-01 ENCOUNTER — Encounter: Payer: Self-pay | Admitting: Neurology

## 2023-11-01 ENCOUNTER — Ambulatory Visit (INDEPENDENT_AMBULATORY_CARE_PROVIDER_SITE_OTHER): Admitting: Neurology

## 2023-11-01 VITALS — BP 136/76 | HR 75 | Ht 67.0 in | Wt 159.8 lb

## 2023-11-01 DIAGNOSIS — Z789 Other specified health status: Secondary | ICD-10-CM

## 2023-11-01 DIAGNOSIS — G4733 Obstructive sleep apnea (adult) (pediatric): Secondary | ICD-10-CM

## 2023-11-01 NOTE — Patient Instructions (Signed)
 I will request a mask refit, to consider you for a different mask such as an under the nose style fullface mask which does not cover the entire nose.  You may be able to tolerate this.  Continue with your AutoPap therapy consistently and follow-up routinely in 1 year, you can call us  if you would like to pursue consultation with a dentist for consideration of an oral appliance, as we discussed.

## 2023-11-01 NOTE — Progress Notes (Signed)
 DME order faxed to [facility]. Received a receipt of confirmation.
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
## 2023-11-02 DIAGNOSIS — I10 Essential (primary) hypertension: Secondary | ICD-10-CM | POA: Diagnosis not present

## 2023-11-02 DIAGNOSIS — F329 Major depressive disorder, single episode, unspecified: Secondary | ICD-10-CM | POA: Diagnosis not present

## 2023-11-02 DIAGNOSIS — G4733 Obstructive sleep apnea (adult) (pediatric): Secondary | ICD-10-CM | POA: Diagnosis not present

## 2023-11-02 DIAGNOSIS — K581 Irritable bowel syndrome with constipation: Secondary | ICD-10-CM | POA: Diagnosis not present

## 2023-11-07 DIAGNOSIS — H40011 Open angle with borderline findings, low risk, right eye: Secondary | ICD-10-CM | POA: Diagnosis not present

## 2024-10-27 ENCOUNTER — Ambulatory Visit: Admitting: Adult Health
